# Patient Record
Sex: Male | Born: 1985
Health system: Southern US, Community
[De-identification: ages and names within clinical notes are randomized; demographics above are authoritative.]

## PROBLEM LIST (undated history)

## (undated) DIAGNOSIS — F419 Anxiety disorder, unspecified: Secondary | ICD-10-CM

## (undated) DIAGNOSIS — T7840XA Allergy, unspecified, initial encounter: Secondary | ICD-10-CM

## (undated) DIAGNOSIS — E781 Pure hyperglyceridemia: Secondary | ICD-10-CM

## (undated) DIAGNOSIS — K219 Gastro-esophageal reflux disease without esophagitis: Secondary | ICD-10-CM

## (undated) DIAGNOSIS — F32A Depression, unspecified: Secondary | ICD-10-CM

## (undated) DIAGNOSIS — B029 Zoster without complications: Secondary | ICD-10-CM

## (undated) DIAGNOSIS — F909 Attention-deficit hyperactivity disorder, unspecified type: Secondary | ICD-10-CM

## (undated) DIAGNOSIS — Z720 Tobacco use: Secondary | ICD-10-CM

## (undated) DIAGNOSIS — M722 Plantar fascial fibromatosis: Secondary | ICD-10-CM

## (undated) DIAGNOSIS — N529 Male erectile dysfunction, unspecified: Secondary | ICD-10-CM

## (undated) DIAGNOSIS — E041 Nontoxic single thyroid nodule: Secondary | ICD-10-CM

## (undated) HISTORY — DX: Morbid (severe) obesity due to excess calories: E66.01

## (undated) HISTORY — DX: Nontoxic single thyroid nodule: E04.1

## (undated) HISTORY — DX: Plantar fascial fibromatosis: M72.2

## (undated) HISTORY — DX: Anxiety disorder, unspecified: F41.9

## (undated) HISTORY — DX: Allergy, unspecified, initial encounter: T78.40XA

## (undated) HISTORY — DX: Depression, unspecified: F32.A

## (undated) HISTORY — DX: Gastro-esophageal reflux disease without esophagitis: K21.9

## (undated) HISTORY — DX: Male erectile dysfunction, unspecified: N52.9

## (undated) HISTORY — DX: Attention-deficit hyperactivity disorder, unspecified type: F90.9

## (undated) HISTORY — DX: Tobacco use: Z72.0

## (undated) HISTORY — DX: Pure hyperglyceridemia: E78.1

## (undated) HISTORY — DX: Zoster without complications: B02.9

---
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
## 2007-01-23 HISTORY — PX: WISDOM TOOTH EXTRACTION: SHX21

## 2008-04-10 ENCOUNTER — Emergency Department (HOSPITAL_BASED_OUTPATIENT_CLINIC_OR_DEPARTMENT_OTHER): Admission: EM | Admit: 2008-04-10 | Discharge: 2008-04-10 | Payer: Self-pay | Admitting: Emergency Medicine

## 2010-05-04 LAB — URINALYSIS, ROUTINE W REFLEX MICROSCOPIC
Glucose, UA: NEGATIVE mg/dL
Hgb urine dipstick: NEGATIVE
Specific Gravity, Urine: 1.025 (ref 1.005–1.030)

## 2010-05-04 LAB — COMPREHENSIVE METABOLIC PANEL
AST: 27 U/L (ref 0–37)
Albumin: 4.1 g/dL (ref 3.5–5.2)
Alkaline Phosphatase: 83 U/L (ref 39–117)
CO2: 25 mEq/L (ref 19–32)
Chloride: 104 mEq/L (ref 96–112)
Creatinine, Ser: 0.9 mg/dL (ref 0.4–1.5)
GFR calc Af Amer: >60 mL/min (ref >60)
GFR calc non Af Amer: >60 mL/min (ref >60)
Potassium: 3.8 mEq/L (ref 3.5–5.1)
Total Bilirubin: 1.2 mg/dL (ref 0.3–1.2)

## 2010-05-04 LAB — CBC
HCT: 45.3 % (ref 39.0–52.0)
MCV: 90 fL (ref 78.0–100.0)
RBC: 5.04 MIL/uL (ref 4.22–5.81)
WBC: 6.5 10*3/uL (ref 4.0–10.5)

## 2010-05-04 LAB — DIFFERENTIAL
Basophils Absolute: 0 10*3/uL (ref 0.0–0.1)
Basophils Relative: 0 % (ref 0–1)
Eosinophils Absolute: 0 10*3/uL (ref 0.0–0.7)
Lymphocytes Relative: 6 % — ABNORMAL LOW (ref 12–46)
Lymphs Abs: 0.4 10*3/uL — ABNORMAL LOW (ref 0.7–4.0)
Monocytes Absolute: 0.1 10*3/uL (ref 0.1–1.0)
Neutro Abs: 6 10*3/uL (ref 1.7–7.7)

## 2010-11-23 DIAGNOSIS — B029 Zoster without complications: Secondary | ICD-10-CM

## 2010-11-23 HISTORY — DX: Zoster without complications: B02.9

## 2010-12-15 ENCOUNTER — Encounter: Payer: Self-pay | Admitting: Family Medicine

## 2010-12-15 ENCOUNTER — Ambulatory Visit (INDEPENDENT_AMBULATORY_CARE_PROVIDER_SITE_OTHER): Payer: 59 | Admitting: Family Medicine

## 2010-12-15 DIAGNOSIS — B029 Zoster without complications: Secondary | ICD-10-CM

## 2010-12-15 DIAGNOSIS — Z23 Encounter for immunization: Secondary | ICD-10-CM

## 2010-12-15 DIAGNOSIS — K219 Gastro-esophageal reflux disease without esophagitis: Secondary | ICD-10-CM

## 2010-12-15 MED ORDER — VALACYCLOVIR HCL 1 G PO TABS: 1000.0000 mg | ORAL_TABLET | Freq: Three times a day (TID) | ORAL | Status: DC

## 2010-12-15 MED ORDER — OMEPRAZOLE 20 MG PO CPDR: 20.0000 mg | DELAYED_RELEASE_CAPSULE | Freq: Every day | ORAL | Status: DC

## 2010-12-15 NOTE — Patient Instructions (Signed)
Gastroesophageal Reflux Disease, Adult Gastroesophageal reflux disease (GERD) happens when acid from your stomach flows up into the esophagus. When acid comes in contact with the esophagus, the acid causes soreness (inflammation) in the esophagus. Over time, GERD may create small holes (ulcers) in the lining of the esophagus. CAUSES   Increased body weight. This puts pressure on the stomach, making acid rise from the stomach into the esophagus.   Smoking. This increases acid production in the stomach.   Drinking alcohol. This causes decreased pressure in the lower esophageal sphincter (valve or ring of muscle between the esophagus and stomach), allowing acid from the stomach into the esophagus.   Late evening meals and a full stomach. This increases pressure and acid production in the stomach.   A malformed lower esophageal sphincter.  Sometimes, no cause is found. SYMPTOMS   Burning pain in the lower part of the mid-chest behind the breastbone and in the mid-stomach area. This may occur twice a week or more often.   Trouble swallowing.   Sore throat.   Dry cough.   Asthma-like symptoms including chest tightness, shortness of breath, or wheezing.  DIAGNOSIS  Your caregiver may be able to diagnose GERD based on your symptoms. In some cases, X-rays and other tests may be done to check for complications or to check the condition of your stomach and esophagus. TREATMENT  Your caregiver may recommend over-the-counter or prescription medicines to help decrease acid production. Ask your caregiver before starting or adding any new medicines.  HOME CARE INSTRUCTIONS   Change the factors that you can control. Ask your caregiver for guidance concerning weight loss, quitting smoking, and alcohol consumption.   Avoid foods and drinks that make your symptoms worse, such as:   Caffeine or alcoholic drinks.   Chocolate.   Peppermint or mint flavorings.   Garlic and onions.   Spicy foods.     Citrus fruits, such as oranges, lemons, or limes.   Tomato-based foods such as sauce, chili, salsa, and pizza.   Fried and fatty foods.   Avoid lying down for the 3 hours prior to your bedtime or prior to taking a nap.   Eat small, frequent meals instead of large meals.   Wear loose-fitting clothing. Do not wear anything tight around your waist that causes pressure on your stomach.   Raise the head of your bed 6 to 8 inches with wood blocks to help you sleep. Extra pillows will not help.   Only take over-the-counter or prescription medicines for pain, discomfort, or fever as directed by your caregiver.   Do not take aspirin, ibuprofen, or other nonsteroidal anti-inflammatory drugs (NSAIDs).  SEEK IMMEDIATE MEDICAL CARE IF:   You have pain in your arms, neck, jaw, teeth, or back.   Your pain increases or changes in intensity or duration.   You develop nausea, vomiting, or sweating (diaphoresis).   You develop shortness of breath, or you faint.   Your vomit is green, yellow, black, or looks like coffee grounds or blood.   Your stool is red, bloody, or black.  These symptoms could be signs of other problems, such as heart disease, gastric bleeding, or esophageal bleeding. MAKE SURE YOU:   Understand these instructions.   Will watch your condition.   Will get help right away if you are not doing well or get worse.  Document Released: 10/18/2004 Document Revised: 09/20/2010 Document Reviewed: 07/28/2010 [facility] Patient Information 2012 [place], [place].Shingles Shingles is caused by the same virus that causes  chickenpox (varicella zoster virus or VZV). Shingles often occurs many years or decades after having chickenpox. That is why it is more common in adults older than 50 years. The virus reactivates and breaks out as an infection in a nerve root. SYMPTOMS   The initial feeling (sensations) may be pain. This pain is usually described as:   Burning.   Stabbing.    Throbbing.   Tingling in the nerve root.   A red rash will follow in a couple days. The rash may occur in any area of the body and is usually on one side (unilateral) of the body in a band or belt-like pattern. The rash usually starts out as very small blisters (vesicles). They will dry up after 7 to 10 days. This is not usually a significant problem except for the pain it causes.   Long-lasting (chronic) pain is more likely in an elderly person. It can last months to years. This condition is called postherpetic neuralgia.  Shingles can be an extremely severe infection in someone with AIDS, a weakened immune system, or with forms of leukemia. It can also be severe if you are taking transplant medicines or other medicines that weaken the immune system. TREATMENT  Your caregiver will often treat you with:  Antiviral drugs.   Anti-inflammatory drugs.   Pain medicines.  Bed rest is very important in preventing the pain associated with herpes zoster (postherpetic neuralgia). Application of heat in the form of a hot water bottle or electric heating pad or gentle pressure with the hand is recommended to help with the pain or discomfort. PREVENTION  A varicella zoster vaccine is available to help protect against the virus. The Food and Drug Administration approved the varicella zoster vaccine for individuals 75 years of age and older. HOME CARE INSTRUCTIONS   Cool compresses to the area of rash may be helpful.   Only take over-the-counter or prescription medicines for pain, discomfort, or fever as directed by your caregiver.   Avoid contact with:   Babies.   Pregnant women.   Children with eczema.   Elderly people with transplants.   People with chronic illnesses, such as leukemia and AIDS.   If the area involved is on your face, you may receive a referral for follow-up to a specialist. It is very important to keep all follow-up appointments. This will help avoid eye complications,  chronic pain, or disability.  SEEK IMMEDIATE MEDICAL CARE IF:   You develop any pain (headache) in the area of the face or eye. This must be followed carefully by your caregiver or ophthalmologist. An infection in part of your eye (cornea) can be very serious. It could lead to blindness.   You do not have pain relief from prescribed medicines.   Your redness or swelling spreads.   The area involved becomes very swollen and painful.   You have a fever.   You notice any red or painful lines extending away from the affected area toward your heart (lymphangitis).   Your condition is worsening or has changed.  Document Released: 01/08/2005 Document Revised: 09/20/2010 Document Reviewed: 12/13/2008 [facility] Patient Information 2012 [place], [place].

## 2010-12-15 NOTE — Progress Notes (Signed)
Office Note 12/16/2010  CC:  Chief Complaint  Patient presents with  . Establish Care    new patient    HPI:  Cesar Young is a 25 y.o. White male who is here to establish care and discuss pain in abdomen. Patient's most recent primary MD: seen intermittently at [facility]gent [facility] on [address] in [place]. Old records were not reviewed prior to or during today's visit.  Pt here with his girlfriend today. He complains of pain in left upper abd area for the last 5d.  It extends around left side into the left side of his back.  Says it seems like it is on the surface, not deep--"like it might be muscle pain or something".  Describes some tingling, prickly sensation in the area as well.  Pain worsening gradually. Has noted a few red bumps appear int the area of pain over the last 2 days.  Pain is the worst in the morning when he wakes up, seems better with activity. Denies any recent abdominal strain or trauma.  Pain unchanged by eating or drinking.  He has applied some heat and this has helped some.  Took some ibuprofen but not consistently, thinks it helped a little.   Denies fever, malaise, constipation, diarrhea, melena, hematochezia, nausea, or vomiting.  He does says he has bad gastroesophageal reflux x "years", mainly up into upper chest and throat region.  Regurgitates frequently.  Takes rolaids some, rarely takes any OTC h2 blocker or PPI.  Admits to drinking lots of Mt Dew and other typical GER-inducing foods, also overeats and eats late in evening frequently.  No significant ST, no cough/aspiration, no voice changes.  Sx's seem the worst right after eating.  He sleeps on 2 pillows but doesn't elevate the head of his bed.    Past Medical History  Diagnosis Date  . Asthma childhood    No albuterol requirement in years per pt report  . Allergic rhinitis   . Plantar fasciitis     [employer] orthotics helping    Past Surgical History  Procedure Date  . Wisdom tooth extraction 2009    . Adenoidectomy 1993    Family History  Problem Relation Age of Onset  . GER disease Mother   . Cancer Father     thyroid cancer ("non-familial type"  . Depression Father     History   Social History  . Marital Status: Single    Spouse Name: N/A    Number of Children: N/A  . Years of Education: N/A   Occupational History  . Not on file.   Social History Main Topics  . Smoking status: Current Everyday Smoker -- 0.5 packs/day for 3 years    Types: Cigarettes  . Smokeless tobacco: Never Used  . Alcohol Use: Yes     1 or 2 beers  . Drug Use: No  . Sexually Active: Not on file   Other Topics Concern  . 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   MEDS CURRENTLY: Tums, ibuprofen prn (the omeprazole and valtrex were added AFTER today's encounter). Outpatient Encounter Prescriptions as of 12/15/2010  Medication Sig Dispense Refill  . calcium carbonate (TUMS - DOSED IN MG ELEMENTAL CALCIUM) 500 MG chewable  tablet Chew 6 tablets by mouth daily.        Marland Kitchen omeprazole (PRILOSEC) 20 MG capsule Take 1 capsule (20 mg total) by mouth daily.  30 capsule  5  . valACYclovir (VALTREX) 1000 MG tablet Take 1 tablet (1,000 mg total) by mouth 3 (three) times daily.  21 tablet  0    Allergies  Allergen Reactions  . Cephalosporins   . Penicillins     ROS Review of Systems  Constitutional: Negative for fever, chills, appetite change and fatigue.  HENT: Negative for ear pain, congestion, sore throat, neck stiffness and dental problem.   Eyes: Negative for discharge, redness and visual disturbance.  Respiratory: Negative for cough, chest tightness, shortness of breath and wheezing.   Cardiovascular: Negative for chest pain,  palpitations and leg swelling.  Gastrointestinal: Negative for nausea, vomiting, diarrhea and blood in stool.       See HPI  Genitourinary: Negative for dysuria, urgency, frequency, hematuria, flank pain and difficulty urinating.  Musculoskeletal: Negative for myalgias, back pain, joint swelling and arthralgias.  Skin: Negative for pallor.       See HPI  Neurological: Negative for dizziness, speech difficulty, weakness and headaches.  Hematological: Negative for adenopathy. Does not bruise/bleed easily.  Psychiatric/Behavioral: Negative for confusion, sleep disturbance and dysphoric mood. The patient is not nervous/anxious.      PE; Blood pressure 139/84, pulse 64, temperature 98 F (36.7 C), temperature source Oral, height 5\' 9"  (1.753 m), weight 243 lb 12.8 oz (110.587 kg), SpO2 97.00%. Gen: Alert, well appearing, mildly obese-appearing.  Patient is oriented to person, place, time, and situation.  Pleasant affect. ENT: Ears: EACs clear, normal epithelium.  TMs with good light reflex and landmarks bilaterally.  Eyes: no injection, icteris, swelling, or exudate.  EOMI, PERRLA. Nose: no drainage or turbinate edema/swelling.  No injection or focal lesion.  Mouth: lips without lesion/swelling.  Oral mucosa pink and moist.  Dentition intact and without obvious caries or gingival swelling.  Oropharynx without erythema, exudate, or swelling.  Neck - No masses or thyromegaly or limitation in range of motion CV: RRR, no m/r/g.   LUNGS: CTA bilat, nonlabored resps, good aeration in all lung fields. ABD: soft, ND, BS normal.  No hepatospenomegaly or mass.  No bruits. He has hypesthesia of LUQ skin, with a couple of small pinkish, papular skin lesions.  No vesicles or petechiae or pustules.  He has mild discomfort at the most when the LUQ is palpated, no change with contraction of abd wall muscles when palpating.  No guarding or rebound. His hypesthesia extends in a dermatomal pattern around his left  side into left side of his back, stopping at the midline.  He has 2 other pink papules in the area of his left side and back in this dermatomal region.  No warmth. EXT: no clubbing, cyanosis, or edema.   Pertinent labs:  none  ASSESSMENT AND PLAN:   New patient: obtain old records.  Herpes zoster Valtrex 1g tid x 7d.  Therapeutic expectations and side effect profile of medication discussed today.  Patient's questions answered. Discussed dx in detail, expected course, contact precautions, etc.  Reviewed educational handout on shingles, gave this to pt to take home. May continue to take ibuprofen or tylenol for pain since it seems pretty mild and he's tolerating it well at this time.  GERD (gastroesophageal reflux disease) Start omeprazole 20mg  qd, encouraged dietary changes and behavioral reflux precautions, reviewed educational handout on GERD and gave this to him  to take home.  He may continue Tums or rolaids prn. F/u in 2 wks.   Tdap and Flu vaccine IM given today.  Return in about 2 weeks (around 12/29/2010) for f/u shingles and GERD.

## 2010-12-16 ENCOUNTER — Telehealth: Payer: Self-pay | Admitting: Family Medicine

## 2010-12-16 ENCOUNTER — Encounter: Payer: Self-pay | Admitting: Family Medicine

## 2010-12-16 DIAGNOSIS — K219 Gastro-esophageal reflux disease without esophagitis: Secondary | ICD-10-CM | POA: Insufficient documentation

## 2010-12-16 NOTE — Assessment & Plan Note (Signed)
Start omeprazole 20mg  qd, encouraged dietary changes and behavioral reflux precautions, reviewed educational handout on GERD and gave this to him to take home.  He may continue Tums or rolaids prn. F/u in 2 wks.

## 2010-12-16 NOTE — Assessment & Plan Note (Signed)
Valtrex 1g tid x 7d.  Therapeutic expectations and side effect profile of medication discussed today.  Patient's questions answered. Discussed dx in detail, expected course, contact precautions, etc.  Reviewed educational handout on shingles, gave this to pt to take home. May continue to take ibuprofen or tylenol for pain since it seems pretty mild and he's tolerating it well at this time.

## 2010-12-16 NOTE — Telephone Encounter (Signed)
Pls request records from [facility]gent [facility] on [address] in [place].  Thx.

## 2010-12-18 NOTE — Telephone Encounter (Signed)
Done

## 2010-12-29 ENCOUNTER — Encounter: Payer: Self-pay | Admitting: Family Medicine

## 2010-12-29 ENCOUNTER — Ambulatory Visit (INDEPENDENT_AMBULATORY_CARE_PROVIDER_SITE_OTHER): Payer: 59 | Admitting: Family Medicine

## 2010-12-29 DIAGNOSIS — B029 Zoster without complications: Secondary | ICD-10-CM

## 2010-12-29 DIAGNOSIS — K219 Gastro-esophageal reflux disease without esophagitis: Secondary | ICD-10-CM

## 2010-12-29 NOTE — Progress Notes (Signed)
OFFICE NOTE  12/29/2010  CC:  Chief Complaint  Patient presents with  . Follow-up    Shingles and Genella Rife     HPI:   Patient is a 25 y.o. Caucasian male who is here for f/u herpes zoster. Resolved 3-4 days ago, some dry bumps only.  No pain. Gerd improved with some dietary changes and elevation of head of bed. No prilosec yet.    Pertinent PMH:  GERD  MEDS;   Outpatient Prescriptions Prior to Visit  Medication Sig Dispense Refill  . calcium carbonate (TUMS - DOSED IN MG ELEMENTAL CALCIUM) 500 MG chewable tablet Chew 6 tablets by mouth daily.        Marland Kitchen omeprazole (PRILOSEC) 20 MG capsule Take 1 capsule (20 mg total) by mouth daily.  30 capsule  5  . valACYclovir (VALTREX) 1000 MG tablet Take 1 tablet (1,000 mg total) by mouth 3 (three) times daily.  21 tablet  0    PE: Blood pressure 132/81, pulse 82, temperature 97.7 F (36.5 C), temperature source Temporal, height 5\' 9"  (1.753 m), weight 247 lb (112.038 kg), SpO2 98.00%. Gen: Alert, well appearing.  Patient is oriented to person, place, time, and situation. ENT: Ears: EACs clear, normal epithelium.  TMs with good light reflex and landmarks bilaterally.  Eyes: no injection, icteris, swelling, or exudate.  EOMI, PERRLA. Nose: no drainage or turbinate edema/swelling.  No injection or focal lesion.  Mouth: lips without lesion/swelling.  Oral mucosa pink and moist.  Dentition intact and without obvious caries or gingival swelling.  Oropharynx without erythema, exudate, or swelling.  Neck - No masses or thyromegaly or limitation in range of motion SKIN: left side of upper abdomen, left flank, and left side of mid back with about 5-6 dry, pinkish papules that are barely palpable.  No hypesthesia of skin in the area anymore.  No erythema.  No tenderness.  IMPRESSION AND PLAN:  Herpes zoster Resolved.  No lingering pain or sensory sx's.  GERD (gastroesophageal reflux disease) Much improved just with dietary mod and elevation of head  of bed.  Gave dexilant 60mg  samples (#20) to use until he fills his omeprazole rx I gave him last time.     FOLLOW UP:  Return if symptoms worsen or fail to improve.

## 2010-12-29 NOTE — Assessment & Plan Note (Signed)
Much improved just with dietary mod and elevation of head of bed.  Gave dexilant 60mg  samples (#20) to use until he fills his omeprazole rx I gave him last time.

## 2010-12-29 NOTE — Assessment & Plan Note (Signed)
Resolved.  No lingering pain or sensory sx's.

## 2011-06-06 ENCOUNTER — Ambulatory Visit: Payer: 59 | Admitting: Family Medicine

## 2011-09-07 ENCOUNTER — Ambulatory Visit (INDEPENDENT_AMBULATORY_CARE_PROVIDER_SITE_OTHER): Payer: 59 | Admitting: Family Medicine

## 2011-09-07 ENCOUNTER — Encounter: Payer: Self-pay | Admitting: Family Medicine

## 2011-09-07 VITALS — BP 119/80 | HR 58 | Temp 97.3°F | Ht 69.0 in | Wt 249.4 lb

## 2011-09-07 DIAGNOSIS — R0789 Other chest pain: Secondary | ICD-10-CM

## 2011-09-07 MED ORDER — OMEPRAZOLE 20 MG PO CPDR: 20.0000 mg | DELAYED_RELEASE_CAPSULE | Freq: Every day | ORAL | Status: DC

## 2011-09-07 NOTE — Assessment & Plan Note (Signed)
Multifactorial: musculoskeletal, stress/anxiety, GERD. Reassured pt that I have low suspicion of any heart or lung problem that is causing this pain. No w/u indicated at this time. Rx for prilosec given today.  Reviewed reflux diet/precautions. Answered questions.  Encouraged regular exercise and stress releasing activities. F/u prn.

## 2011-09-07 NOTE — Progress Notes (Signed)
Office Note 09/07/2011  CC:  Chief Complaint  Patient presents with  . Annual Exam    physical    HPI:  Cesar Young is a 26 y.o. White male who is here for evaluation of recent CP. Onset a couple of weeks ago, pain in left upper chest, dull pain lasting a few minutes, not exertional. Spontaneously resolved.  One day when it lasted on and off x 30 min he took one ASA and he feels like this may have helped.  No nausea, no diaphoresis, no radiation of pain into arm or neck/jaw.  New job working long hours, feels like this is stressing him out b/c he feels like he doesn't have a life anymore.  Denies depression. He quit smoking b/c of fear 1 wk ago.  He started dipping now. Occasionally exercises (jogging): no pain with this.  Some exertional work with his job does not induce chest pain.  Describes some burning in epigastric region sometimes assoc with the pain in chest, but usually unrelated.  Lately has been taking Tums and pepcid complete lately: gets temporary relief only. No prolonged immobilization lately.   Past Medical History  Diagnosis Date  . Asthma childhood    No albuterol requirement in years per pt report  . Allergic rhinitis   . Plantar fasciitis     [employer] orthotics helping    Past Surgical History  Procedure Date  . Wisdom tooth extraction 2009  . Adenoidectomy 1993    Family History  Problem Relation Age of Onset  . GER disease Mother   . Cancer Father     thyroid cancer ("non-familial type"  . Depression Father   MGF: blood clots, recurrent (?unclear hx).  History   Social History  . Marital Status: Single    Spouse Name: N/A    Number of Children: N/A  . Years of Education: N/A   Occupational History  . Not on file.   Social History Main Topics  . Smoking status: Former Smoker -- 0.5 packs/day for 3 years    Types: Cigarettes    Quit date: 08/31/2011  . Smokeless tobacco: Current User  . Alcohol Use: Yes     occasionally 1 or 2  beers every couple months  . Drug Use: No  . Sexually Active: Yes -- Male partner(s)   Other Topics Concern  . 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    Outpatient Prescriptions Prior to Visit  Medication Sig Dispense Refill  . calcium carbonate (TUMS - DOSED IN MG ELEMENTAL CALCIUM) 500 MG chewable tablet Chew 6 tablets by mouth daily.        Marland Kitchen omeprazole (PRILOSEC) 20 MG capsule Take 1 capsule (20 mg total) by mouth daily.  30 capsule  5  . valACYclovir (VALTREX) 1000 MG tablet Take 1 tablet (1,000 mg total) by mouth 3 (three) times daily.  21 tablet  0    Allergies  Allergen Reactions  . Cephalosporins   . Penicillins     ROS Review of Systems  Constitutional: Negative for fever, chills, appetite change and fatigue.  HENT: Negative for ear pain, congestion, sore throat, neck stiffness and dental problem.   Eyes: Negative for  discharge, redness and visual disturbance.  Respiratory: Negative for cough, chest tightness, shortness of breath and wheezing.   Cardiovascular: Negative for palpitations and leg swelling.  Gastrointestinal: Positive for abdominal pain (intermittent mid epigastric burning discomfort). Negative for nausea, vomiting, diarrhea and blood in stool.  Genitourinary: Negative for dysuria, urgency, frequency, hematuria, flank pain and difficulty urinating.  Musculoskeletal: Negative for myalgias, back pain, joint swelling and arthralgias.  Skin: Negative for pallor and rash.  Neurological: Negative for dizziness, speech difficulty, weakness and headaches.  Hematological: Negative for adenopathy. Does not bruise/bleed easily.  Psychiatric/Behavioral: Negative for  confusion and disturbed wake/sleep cycle. The patient is not nervous/anxious.     PE; Blood pressure 119/80, pulse 58, temperature 97.3 F (36.3 C), temperature source Temporal, height 5\' 9"  (1.753 m), weight 249 lb 6.4 oz (113.127 kg), SpO2 99.00%. Gen: Alert, well appearing, overweight white male..  Patient is oriented to person, place, time, and situation. Affect: pleasant.   Lucid thought and speech. ENT: Eyes: no injection, icteris, swelling, or exudate.  EOMI, PERRLA. Nose: no drainage or turbinate edema/swelling.  No injection or focal lesion.  Mouth: lips without lesion/swelling.  Oral mucosa pink and moist.  Dentition intact and without obvious caries or gingival swelling.  Oropharynx without erythema, exudate, or swelling.  Neck: supple/nontender.  No LAD, mass, or TM.  Carotid pulses 2+ bilaterally, without bruits. CV: RRR, no m/r/g.   LUNGS: CTA bilat, nonlabored resps, good aeration in all lung fields. ABD: soft, NT, ND, BS normal.  No hepatospenomegaly or mass.  No bruits. EXT: no clubbing, cyanosis, or edema.   Pertinent labs:  none  ASSESSMENT AND PLAN:   Atypical chest pain Multifactorial: musculoskeletal, stress/anxiety, GERD. Reassured pt that I have low suspicion of any heart or lung problem that is causing this pain. No w/u indicated at this time. Rx for prilosec given today.  Reviewed reflux diet/precautions. Answered questions.  Encouraged regular exercise and stress releasing activities. F/u prn.    FOLLOW UP:  Return if symptoms worsen or fail to improve.

## 2011-09-14 ENCOUNTER — Telehealth: Payer: Self-pay | Admitting: Family Medicine

## 2011-09-14 NOTE — Telephone Encounter (Signed)
Calling for sore throat that started 09/11/11, tonsils swollen, afebrile, wants appointment for 09/15/11 and needs note for work. Emergent signs and symptoms ruled out as per Sore Throat protocol and home care advice given, scheduled appointment as per request for 09/15/11 at [facility] [address] Dr[address] Milinda Cave.

## 2011-09-15 ENCOUNTER — Encounter: Payer: Self-pay | Admitting: Family Medicine

## 2011-09-15 ENCOUNTER — Ambulatory Visit (INDEPENDENT_AMBULATORY_CARE_PROVIDER_SITE_OTHER): Payer: 59 | Admitting: Family Medicine

## 2011-09-15 VITALS — BP 122/62 | HR 57 | Temp 97.9°F | Ht 70.0 in | Wt 254.0 lb

## 2011-09-15 DIAGNOSIS — J029 Acute pharyngitis, unspecified: Secondary | ICD-10-CM

## 2011-09-15 LAB — POCT RAPID STREP A (OFFICE): Rapid Strep A Screen: NEGATIVE

## 2011-09-15 NOTE — Progress Notes (Signed)
OFFICE NOTE  09/15/2011  CC:  Chief Complaint  Patient presents with  . Sore Throat    x 3 days and swollen tonsils     HPI: Patient is a 26 y.o. Caucasian male who is here for sore throat. Onset 4 days ago, swelling of tonsils and uvula noted by pt, took tylenol twice yesterday and it was helpful--feels improved today.  No fever.  No nasal sx's.  Only occasional cough.  Feeling a bit tired but also says he works a lot of hours, up on feet all day.  However, felt tired today and he got 9 hours of sleep last night. No rash.  Mild HA on and off in last 4d--comes and goes.  Pertinent PMH:  Past Medical History  Diagnosis Date  . Asthma childhood    No albuterol requirement in years per pt report  . Allergic rhinitis   . Plantar fasciitis     [employer] orthotics helping    MEDS:  Outpatient Prescriptions Prior to Visit  Medication Sig Dispense Refill  . calcium carbonate (TUMS - DOSED IN MG ELEMENTAL CALCIUM) 500 MG chewable tablet Chew 6 tablets by mouth daily.        Marland Kitchen omeprazole (PRILOSEC) 20 MG capsule Take 1 capsule (20 mg total) by mouth daily.  90 capsule  1    PE: Blood pressure 122/62, pulse 57, temperature 97.9 F (36.6 C), temperature source Oral, height 5\' 10"  (1.778 m), weight 254 lb (115.214 kg), SpO2 97.00%. Gen: Alert, well appearing.  Patient is oriented to person, place, time, and situation. ENT: Ears: EACs clear, normal epithelium.  TMs with good light reflex and landmarks bilaterally.  Eyes: no injection, icteris, swelling, or exudate.  EOMI, PERRLA. Nose: no drainage or turbinate edema/swelling.  No injection or focal lesion.  Mouth: lips without lesion/swelling.  Oral mucosa pink and moist.  Dentition intact and without obvious caries or gingival swelling.  Oropharynx with mild diffuse tonsillar and soft palate and posterior pharynx erythema but no exudate.  Mild symmetric swelling.   CV: RRR, no m/r/g.   LUNGS: CTA bilat, nonlabored resps, good aeration in  all lung fields.  Lab: rapid strep negative today  IMPRESSION AND PLAN:   Viral pharyngitis. Symptomatic care discussed. Rest, fluids, etc.  FOLLOW UP: prn

## 2011-12-27 ENCOUNTER — Telehealth: Payer: Self-pay | Admitting: Family Medicine

## 2011-12-27 NOTE — Telephone Encounter (Signed)
Patient Information:  Caller Name: Newel  Phone: [phone]  Patient: Cesar Young, Cesar Young  Gender: Male  DOB: 06/26/1985  Age: 26 Years  PCP: Earley Favor [facility])   Symptoms  Reason For Call & Symptoms: Patient is calling about a headache. Onset 3 days. Pain is constant and it will with medication (Tylenol 500mg ) but then it comes back.  location- middle of forehead and behind eyes. Slight congestion/slight cough but very minimal. +nausea  Reviewed Health History In EMR: Yes  Reviewed Medications In EMR: Yes  Reviewed Allergies In EMR: Yes  Reviewed Surgeries / Procedures: No  Date of Onset of Symptoms: 12/24/2011  Treatments Tried: tylenol  Treatments Tried Worked: No  Guideline(s) Used:  Headache  Disposition Per Guideline:   See Today or Tomorrow in Office  Reason For Disposition Reached:   Unexplained headache that is present > 24 hours  Advice Given:  Reassurance - Migraine Headache:  This sounds like a painful headache that you are having, but there are pain medications you can take and other instructions I can give you to reduce the pain.  Reassurance - Muscle Tension Headache:  The majority of headaches are caused by muscle tension.  This sounds like a painful headache that you are having, but there are pain medications you can take and other instructions I can give you to reduce the pain.  Pain Medicines:  For pain relief, you can take either acetaminophen, ibuprofen, or naproxen.  They are over-the-counter (OTC) pain drugs. You can buy them at the drugstore.  Pain Medicines:  For pain relief, you can take either acetaminophen, ibuprofen, or naproxen.  They are over-the-counter (OTC) pain drugs. You can buy them at the drugstore.  Acetaminophen (e.g., Tylenol):  Extra Strength Tylenol: Take 1,000 mg (two 500 mg pills) every 8 hours as needed. Each Extra Strength Tylenol pill has 500 mg of acetaminophen.  Ibuprofen (e.g., Motrin, Advil):  Take 400  mg (two 200 mg pills) by mouth every 6 hours.  Rest:   Lie down in a dark, quiet place and try to relax. Close your eyes and imagine your entire body relaxing.  Rest:   Lie down in a dark, quiet place and try to relax. Close your eyes and imagine your entire body relaxing.  Apply Cold to the Area:   Apply a cold wet washcloth or cold pack to the forehead for 20 minutes.  Apply Cold to the Area:   Apply a cold wet washcloth or cold pack to the forehead for 20 minutes.  Call Back If:  Headache lasts longer than 24 hours  You become worse.  Office Follow Up:  Does the office need to follow up with this patient?: No  Instructions For The Office: N/A  Appointment Scheduled:  12/28/2011 15:00:00 Appointment Scheduled Provider:  Danise Edge (Family Practice)  RN Note:  He states he drinks Energy drinks , [facility] and 5 hours energy daily and has 2 sodas as well.  Advised to stop energy drinks. Increase fluids.   Encouraged to increase Tylenol to 500mg  every 8 hours x2  Tablets.

## 2011-12-28 ENCOUNTER — Ambulatory Visit (INDEPENDENT_AMBULATORY_CARE_PROVIDER_SITE_OTHER): Payer: 59 | Admitting: Family Medicine

## 2011-12-28 ENCOUNTER — Encounter: Payer: Self-pay | Admitting: Family Medicine

## 2011-12-28 VITALS — BP 126/72 | HR 87 | Temp 98.2°F | Ht 69.0 in | Wt 251.8 lb

## 2011-12-28 DIAGNOSIS — Z720 Tobacco use: Secondary | ICD-10-CM

## 2011-12-28 DIAGNOSIS — F17200 Nicotine dependence, unspecified, uncomplicated: Secondary | ICD-10-CM

## 2011-12-28 DIAGNOSIS — R51 Headache: Secondary | ICD-10-CM | POA: Insufficient documentation

## 2011-12-28 DIAGNOSIS — Z23 Encounter for immunization: Secondary | ICD-10-CM

## 2011-12-28 DIAGNOSIS — R41840 Attention and concentration deficit: Secondary | ICD-10-CM

## 2011-12-28 DIAGNOSIS — R519 Headache, unspecified: Secondary | ICD-10-CM | POA: Insufficient documentation

## 2011-12-28 HISTORY — DX: Tobacco use: Z72.0

## 2011-12-28 MED ORDER — BUTALBITAL-ACETAMINOPHEN 50-325 MG PO TABS: 1.0000 | ORAL_TABLET | Freq: Three times a day (TID) | ORAL | Status: DC | PRN

## 2011-12-28 NOTE — Patient Instructions (Addendum)
Make sure to get enough sleep and hydrate and eat regularly Headaches, Frequently Asked Questions MIGRAINE HEADACHES Q: What is migraine? What causes it? How can I treat it? A: Generally, migraine headaches begin as a dull ache. Then they develop into a constant, throbbing, and pulsating pain. You may experience pain at the temples. You may experience pain at the front or back of one or both sides of the head. The pain is usually accompanied by a combination of:  Nausea.  Vomiting.  Sensitivity to light and noise. Some people (about 15%) experience an aura (see below) before an attack. The cause of migraine is believed to be chemical reactions in the brain. Treatment for migraine may include over-the-counter or prescription medications. It may also include self-help techniques. These include relaxation training and biofeedback.  Q: What is an aura? A: About 15% of people with migraine get an "aura". This is a sign of neurological symptoms that occur before a migraine headache. You may see wavy or jagged lines, dots, or flashing lights. You might experience tunnel vision or blind spots in one or both eyes. The aura can include visual or auditory hallucinations (something imagined). It may include disruptions in smell (such as strange odors), taste or touch. Other symptoms include:  Numbness.  A "pins and needles" sensation.  Difficulty in recalling or speaking the correct word. These neurological events may last as long as 60 minutes. These symptoms will fade as the headache begins. Q: What is a trigger? A: Certain physical or environmental factors can lead to or "trigger" a migraine. These include:  Foods.  Hormonal changes.  Weather.  Stress. It is important to remember that triggers are different for everyone. To help prevent migraine attacks, you need to figure out which triggers affect you. Keep a headache diary. This is a good way to track triggers. The diary will help you talk to  your healthcare professional about your condition. Q: Does weather affect migraines? A: Bright sunshine, hot, humid conditions, and drastic changes in barometric pressure may lead to, or "trigger," a migraine attack in some people. But studies have shown that weather does not act as a trigger for everyone with migraines. Q: What is the link between migraine and hormones? A: Hormones start and regulate many of your body's functions. Hormones keep your body in balance within a constantly changing environment. The levels of hormones in your body are unbalanced at times. Examples are during menstruation, pregnancy, or menopause. That can lead to a migraine attack. In fact, about three quarters of all women with migraine report that their attacks are related to the menstrual cycle.  Q: Is there an increased risk of stroke for migraine sufferers? A: The likelihood of a migraine attack causing a stroke is very remote. That is not to say that migraine sufferers cannot have a stroke associated with their migraines. In persons under age 59, the most common associated factor for stroke is migraine headache. But over the course of a person's normal life span, the occurrence of migraine headache may actually be associated with a reduced risk of dying from cerebrovascular disease due to stroke.  Q: What are acute medications for migraine? A: Acute medications are used to treat the pain of the headache after it has started. Examples over-the-counter medications, NSAIDs, ergots, and triptans.  Q: What are the triptans? A: Triptans are the newest class of abortive medications. They are specifically targeted to treat migraine. Triptans are vasoconstrictors. They moderate some chemical reactions in the  brain. The triptans work on receptors in your brain. Triptans help to restore the balance of a neurotransmitter called serotonin. Fluctuations in levels of serotonin are thought to be a main cause of migraine.  Q: Are  over-the-counter medications for migraine effective? A: Over-the-counter, or "OTC," medications may be effective in relieving mild to moderate pain and associated symptoms of migraine. But you should see your caregiver before beginning any treatment regimen for migraine.  Q: What are preventive medications for migraine? A: Preventive medications for migraine are sometimes referred to as "prophylactic" treatments. They are used to reduce the frequency, severity, and length of migraine attacks. Examples of preventive medications include antiepileptic medications, antidepressants, beta-blockers, calcium channel blockers, and NSAIDs (nonsteroidal anti-inflammatory drugs). Q: Why are anticonvulsants used to treat migraine? A: During the past few years, there has been an increased interest in antiepileptic drugs for the prevention of migraine. They are sometimes referred to as "anticonvulsants". Both epilepsy and migraine may be caused by similar reactions in the brain.  Q: Why are antidepressants used to treat migraine? A: Antidepressants are typically used to treat people with depression. They may reduce migraine frequency by regulating chemical levels, such as serotonin, in the brain.  Q: What alternative therapies are used to treat migraine? A: The term "alternative therapies" is often used to describe treatments considered outside the scope of conventional Western medicine. Examples of alternative therapy include acupuncture, acupressure, and yoga. Another common alternative treatment is herbal therapy. Some herbs are believed to relieve headache pain. Always discuss alternative therapies with your caregiver before proceeding. Some herbal products contain arsenic and other toxins. TENSION HEADACHES Q: What is a tension-type headache? What causes it? How can I treat it? A: Tension-type headaches occur randomly. They are often the result of temporary stress, anxiety, fatigue, or anger. Symptoms include  soreness in your temples, a tightening band-like sensation around your head (a "vice-like" ache). Symptoms can also include a pulling feeling, pressure sensations, and contracting head and neck muscles. The headache begins in your forehead, temples, or the back of your head and neck. Treatment for tension-type headache may include over-the-counter or prescription medications. Treatment may also include self-help techniques such as relaxation training and biofeedback. CLUSTER HEADACHES Q: What is a cluster headache? What causes it? How can I treat it? A: Cluster headache gets its name because the attacks come in groups. The pain arrives with little, if any, warning. It is usually on one side of the head. A tearing or bloodshot eye and a runny nose on the same side of the headache may also accompany the pain. Cluster headaches are believed to be caused by chemical reactions in the brain. They have been described as the most severe and intense of any headache type. Treatment for cluster headache includes prescription medication and oxygen. SINUS HEADACHES Q: What is a sinus headache? What causes it? How can I treat it? A: When a cavity in the bones of the face and skull (a sinus) becomes inflamed, the inflammation will cause localized pain. This condition is usually the result of an allergic reaction, a tumor, or an infection. If your headache is caused by a sinus blockage, such as an infection, you will probably have a fever. An x-ray will confirm a sinus blockage. Your caregiver's treatment might include antibiotics for the infection, as well as antihistamines or decongestants.  REBOUND HEADACHES Q: What is a rebound headache? What causes it? How can I treat it? A: A pattern of taking acute headache medications  too often can lead to a condition known as "rebound headache." A pattern of taking too much headache medication includes taking it more than 2 days per week or in excessive amounts. That means more  than the label or a caregiver advises. With rebound headaches, your medications not only stop relieving pain, they actually begin to cause headaches. Doctors treat rebound headache by tapering the medication that is being overused. Sometimes your caregiver will gradually substitute a different type of treatment or medication. Stopping may be a challenge. Regularly overusing a medication increases the potential for serious side effects. Consult a caregiver if you regularly use headache medications more than 2 days per week or more than the label advises. ADDITIONAL QUESTIONS AND ANSWERS Q: What is biofeedback? A: Biofeedback is a self-help treatment. Biofeedback uses special equipment to monitor your body's involuntary physical responses. Biofeedback monitors:  Breathing.  Pulse.  Heart rate.  Temperature.  Muscle tension.  Brain activity. Biofeedback helps you refine and perfect your relaxation exercises. You learn to control the physical responses that are related to stress. Once the technique has been mastered, you do not need the equipment any more. Q: Are headaches hereditary? A: Four out of five (80%) of people that suffer report a family history of migraine. Scientists are not sure if this is genetic or a family predisposition. Despite the uncertainty, a child has a 50% chance of having migraine if one parent suffers. The child has a 75% chance if both parents suffer.  Q: Can children get headaches? A: By the time they reach high school, most young people have experienced some type of headache. Many safe and effective approaches or medications can prevent a headache from occurring or stop it after it has begun.  Q: What type of doctor should I see to diagnose and treat my headache? A: Start with your primary caregiver. Discuss his or her experience and approach to headaches. Discuss methods of classification, diagnosis, and treatment. Your caregiver may decide to recommend you to a  headache specialist, depending upon your symptoms or other physical conditions. Having diabetes, allergies, etc., may require a more comprehensive and inclusive approach to your headache. The National Headache Foundation will provide, upon request, a list of [facility] physician members in your state. Document Released: 03/31/2003 Document Revised: 04/02/2011 Document Reviewed: 09/08/2007 [facility] Patient Information 2013 [place], [place].

## 2011-12-31 ENCOUNTER — Encounter: Payer: Self-pay | Admitting: Family Medicine

## 2011-12-31 DIAGNOSIS — R41840 Attention and concentration deficit: Secondary | ICD-10-CM | POA: Insufficient documentation

## 2011-12-31 NOTE — Progress Notes (Signed)
Patient ID: Cesar Young, male   DOB: [birth year], 26 y.o.   MRN: [id number] HAWKE VILLALPANDO [id number] 1985-06-22 12/31/2011      Progress Note-Follow Up  Subjective  Chief Complaint  Chief Complaint  Patient presents with  . Headache    started on Sunday- Wed- then came back around 4 pm on thursday. slight headache today    HPI  Patient is a 26 year old Caucasian male who is in today for evaluation of headache he actually does one today but has had one off and on this last week more consistently than usual. Acknowledges he had 3-4 days of a constant headache which would respond to Tylenol but then would come back. He does note she also had some congestion malaise and some low-grade fevers as well. A few weeks back into more transitory headache had some nausea associated with that is not ongoing. Today no fevers no significant congestion. He also notes that his girlfriend is recently been diagnosed with ADD and is questioning whether to look may have it as well. No chest pain, palpitations, GI or GU complaints noted today.  Past Medical History  Diagnosis Date  . Asthma childhood    No albuterol requirement in years per pt report  . Allergic rhinitis   . Plantar fasciitis     [employer] orthotics helping  . Headache 12/28/2011  . Tobacco abuse 12/28/2011  . Poor concentration 12/31/2011    Past Surgical History  Procedure Date  . Wisdom tooth extraction 2009  . Adenoidectomy 1993    Family History  Problem Relation Age of Onset  . GER disease Mother   . Cancer Father     thyroid cancer ("non-familial type"  . Depression Father     History   Social History  . Marital Status: Single    Spouse Name: N/A    Number of Children: N/A  . Years of Education: N/A   Occupational History  . Not on file.   Social History Main Topics  . Smoking status: Current Some Day Smoker -- 0.5 packs/day for 3 years    Types: Cigarettes    Last Attempt to Quit: 08/31/2011  . Smokeless  tobacco: Current User  . Alcohol Use: Yes     Comment: occasionally 1 or 2 beers every couple months  . Drug Use: No  . Sexually Active: Yes -- Male partner(s)   Other Topics Concern  . 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, [place].Smokes 1/2 pack cigs/day x 3 yrs--recently quit as of 09/07/11!:  rare alcohol, no drug use.No exercise.    Current Outpatient Prescriptions on File Prior to Visit  Medication Sig Dispense Refill  . calcium carbonate (TUMS - DOSED IN MG ELEMENTAL CALCIUM) 500 MG chewable tablet Chew 6 tablets by mouth daily.        Marland Kitchen omeprazole (PRILOSEC) 20 MG capsule Take 1 capsule (20 mg total) by mouth daily.  90 capsule  1    Allergies  Allergen Reactions  . Cephalosporins   . Penicillins     Review of Systems  Review of Systems  Constitutional: Negative for fever and malaise/fatigue.  HENT: Positive for congestion.   Eyes: Negative for discharge.  Respiratory: Negative for shortness of breath.   Cardiovascular: Negative for chest pain, palpitations and leg swelling.  Gastrointestinal: Negative for nausea, abdominal pain and diarrhea.  Genitourinary: Negative for dysuria.  Musculoskeletal: Negative for falls.  Skin: Negative for rash.  Neurological: Positive for headaches. Negative for loss of consciousness.  Endo/Heme/Allergies: Negative for polydipsia.  Psychiatric/Behavioral: Negative for depression and suicidal ideas. The patient is not nervous/anxious and does not have insomnia.     Objective  BP 126/72  Pulse 87  Temp 98.2 F (36.8 C) (Temporal)  Ht 5\' 9"  (1.753 m)  Wt 251 lb 12.8 oz (114.216 kg)  BMI 37.18 kg/m2  SpO2 99%  Physical Exam  Physical Exam  Constitutional: He is oriented to person, place, and time and well-developed, well-nourished, and in no  distress. No distress.  HENT:  Head: Normocephalic and atraumatic.  Eyes: Conjunctivae normal are normal.  Neck: Neck supple. No thyromegaly present.  Cardiovascular: Normal rate, regular rhythm and normal heart sounds.   No murmur heard. Pulmonary/Chest: Effort normal and breath sounds normal. No respiratory distress.  Abdominal: He exhibits no distension and no mass. There is no tenderness.  Musculoskeletal: He exhibits no edema.  Neurological: He is alert and oriented to person, place, and time.  Skin: Skin is warm.  Psychiatric: Memory, affect and judgment normal.    No results found for this basename: TSH   Lab Results  Component Value Date   WBC 6.5 04/10/2008   HGB 15.3 04/10/2008   HCT 45.3 04/10/2008   MCV 90.0 04/10/2008   PLT 167 04/10/2008   Lab Results  Component Value Date   CREATININE .9 04/10/2008   BUN 16 04/10/2008   NA 137 04/10/2008   K 3.8 04/10/2008   CL 104 04/10/2008   CO2 25 04/10/2008   Lab Results  Component Value Date   ALT 15 04/10/2008   AST 27 04/10/2008   ALKPHOS 83 04/10/2008   BILITOT 1.2 04/10/2008    Assessment & Plan  Headache Actually better today, responded to tylenol yesterday, encouraged better hydration, diet and sleep, given a small amount of Butalbital to try prn  Tobacco abuse Encouraged complete cessation  Poor concentration He is questioning if he has ADD, he is scheduled to come back for some testing with his PMD after the holidays.

## 2011-12-31 NOTE — Assessment & Plan Note (Signed)
Encouraged complete cessation. 

## 2011-12-31 NOTE — Assessment & Plan Note (Addendum)
Actually better today, responded to tylenol yesterday, encouraged better hydration, diet and sleep, given a small amount of Butalbital to try prn

## 2011-12-31 NOTE — Assessment & Plan Note (Signed)
He is questioning if he has ADD, he is scheduled to come back for some testing with his PMD after the holidays.

## 2012-01-23 DIAGNOSIS — N529 Male erectile dysfunction, unspecified: Secondary | ICD-10-CM

## 2012-01-23 HISTORY — DX: Male erectile dysfunction, unspecified: N52.9

## 2012-02-25 ENCOUNTER — Encounter: Payer: Self-pay | Admitting: Family Medicine

## 2012-02-25 ENCOUNTER — Ambulatory Visit (INDEPENDENT_AMBULATORY_CARE_PROVIDER_SITE_OTHER): Payer: 59 | Admitting: Family Medicine

## 2012-02-25 VITALS — BP 139/75 | HR 71 | Temp 99.8°F | Ht 70.0 in | Wt 258.0 lb

## 2012-02-25 DIAGNOSIS — R0609 Other forms of dyspnea: Secondary | ICD-10-CM

## 2012-02-25 DIAGNOSIS — R0989 Other specified symptoms and signs involving the circulatory and respiratory systems: Secondary | ICD-10-CM

## 2012-02-25 DIAGNOSIS — R0600 Dyspnea, unspecified: Secondary | ICD-10-CM

## 2012-02-25 DIAGNOSIS — J45901 Unspecified asthma with (acute) exacerbation: Secondary | ICD-10-CM

## 2012-02-25 MED ORDER — IPRATROPIUM BROMIDE 0.02 % IN SOLN: 0.5000 mg | Freq: Once | RESPIRATORY_TRACT | Status: DC

## 2012-02-25 MED ORDER — PREDNISONE 20 MG PO TABS: ORAL_TABLET | ORAL | Status: DC

## 2012-02-25 MED ORDER — ALBUTEROL SULFATE HFA 108 (90 BASE) MCG/ACT IN AERS: INHALATION_SPRAY | RESPIRATORY_TRACT | Status: DC

## 2012-02-25 MED ORDER — ALBUTEROL SULFATE (5 MG/ML) 0.5% IN NEBU: 2.5000 mg | INHALATION_SOLUTION | Freq: Once | RESPIRATORY_TRACT | Status: DC

## 2012-02-25 NOTE — Assessment & Plan Note (Signed)
Non-wheezing asthmatic. He had good response to alb/atr neb in office today. I rx'd prednisone 40mg  qd x 5d and ventolin HFA 2 puffs q4h prn. CMA did inhaler education with him today in exam room.

## 2012-02-25 NOTE — Progress Notes (Signed)
OFFICE NOTE  02/25/2012  CC:  Chief Complaint  Patient presents with  . Dizziness    short of breath, chest tightness; symptoms began Sunday; chest congestion     HPI: Patient is a 27 y.o. Caucasian male who is here for 1d of feeling like he can't take deep breaths--"shortness of breath".  Tightness in chest.  Some dizzy spells the last few days, would last 15-20 min, triggered sometimes by bending over and grabbing something and sometimes just without provocation.  Of note, these spells started the morning after he had gotten acutely intoxicated, felt hungover. No palpitations or feeling of heart racing.  No recent URI or fever.  He has had a dry cough when he has been able to take a deep breath.  Denies hearing any wheezing.  No signif anxiety-provoking situations lately. Meds: took dramamine yesterday, tylenol--no signif help. He quit smoking 08/2011 but restarted and smokes around 1/2-1 pack of cigs per day.  Pertinent PMH:  Past Medical History  Diagnosis Date  . Asthma childhood    No albuterol requirement in years per pt report  . Allergic rhinitis   . Plantar fasciitis     [employer] orthotics helping  . Headache 12/28/2011  . Tobacco abuse 12/28/2011  . Poor concentration 12/31/2011   Past surgical, social, and family history reviewed and no changes noted since last office visit. (GF with hx of "clots", otherwise neg FH for venous thrombosis/PE).  No recent surgical procedure or prolonged immobilization.  MEDS:  Outpatient Prescriptions Prior to Visit  Medication Sig Dispense Refill  . acetaminophen (TYLENOL) 500 MG tablet Take 1,000 mg by mouth every 6 (six) hours as needed.      . calcium carbonate (TUMS - DOSED IN MG ELEMENTAL CALCIUM) 500 MG chewable tablet Chew 6 tablets by mouth daily.        Marland Kitchen omeprazole (PRILOSEC) 20 MG capsule Take 1 capsule (20 mg total) by mouth daily.  90 capsule  1  . ACETAMINOPHEN-BUTALBITAL 50-325 MG TABS Take 1 tablet by mouth 3 (three) times  daily as needed.  30 each  0   Last reviewed on 02/25/2012  4:12 PM by Jeoffrey Massed, MD  PE: Blood pressure 139/75, pulse 71, temperature 99.8 F (37.7 C), temperature source Temporal, height 5\' 10"  (1.778 m), weight 258 lb (117.028 kg), SpO2 98.00%. Gen: Alert, well appearing.  Patient is oriented to person, place, time, and situation. ENT: Ears: EACs clear, normal epithelium.  TMs with good light reflex and landmarks bilaterally.  Eyes: no injection, icteris, swelling, or exudate.  EOMI, PERRLA. Nose: no drainage or turbinate edema/swelling.  No injection or focal lesion.  Mouth: lips without lesion/swelling.  Oral mucosa pink and moist.  Dentition intact and without obvious caries or gingival swelling.  Oropharynx without erythema, exudate, or swelling.  Neck - No masses or thyromegaly or limitation in range of motion CV: RRR, no m/r/g.   LUNGS: CTA bilat, nonlabored resps, good aeration in all lung fields. ABD: soft, NT/ND EXT: no clubbing, cyanosis, or edema.    IMPRESSION AND PLAN:  Acute asthma exacerbation Non-wheezing asthmatic. He had good response to alb/atr neb in office today. I rx'd prednisone 40mg  qd x 5d and ventolin HFA 2 puffs q4h prn. CMA did inhaler education with him today in exam room.   An After Visit Summary was printed and given to the patient.  FOLLOW UP: prn

## 2012-02-27 MED ORDER — IPRATROPIUM-ALBUTEROL 0.5-2.5 (3) MG/3ML IN SOLN: 3.0000 mL | RESPIRATORY_TRACT | Status: DC

## 2012-02-27 MED ORDER — IPRATROPIUM-ALBUTEROL 0.5-2.5 (3) MG/3ML IN SOLN
3.0000 mL | Freq: Once | RESPIRATORY_TRACT | Status: AC
  Administered 2012-02-27: 3 mL via RESPIRATORY_TRACT

## 2012-02-27 NOTE — Addendum Note (Signed)
Addended by: Luisa Dago on: 02/27/2012 08:45 AM   Modules accepted: Orders

## 2012-10-23 ENCOUNTER — Encounter: Payer: Self-pay | Admitting: Family Medicine

## 2012-10-23 ENCOUNTER — Ambulatory Visit (INDEPENDENT_AMBULATORY_CARE_PROVIDER_SITE_OTHER): Payer: 59 | Admitting: Family Medicine

## 2012-10-23 VITALS — BP 134/77 | HR 82 | Temp 99.2°F | Resp 18 | Ht 70.0 in | Wt 251.0 lb

## 2012-10-23 DIAGNOSIS — F17200 Nicotine dependence, unspecified, uncomplicated: Secondary | ICD-10-CM

## 2012-10-23 DIAGNOSIS — E66811 Obesity, class 1: Secondary | ICD-10-CM

## 2012-10-23 DIAGNOSIS — E669 Obesity, unspecified: Secondary | ICD-10-CM

## 2012-10-23 DIAGNOSIS — N529 Male erectile dysfunction, unspecified: Secondary | ICD-10-CM

## 2012-10-23 DIAGNOSIS — Z0000 Encounter for general adult medical examination without abnormal findings: Secondary | ICD-10-CM

## 2012-10-23 NOTE — Assessment & Plan Note (Signed)
Reviewed age and gender appropriate health maintenance issues (prudent diet, regular exercise, health risks of tobacco and excessive alcohol, use of seatbelts, fire alarms in home, use of sunscreen).  Also reviewed age and gender appropriate health screening as well as vaccine recommendations. He declined flu vaccine today. He'll return when fasting to get screening BMET and FLP.

## 2012-10-23 NOTE — Assessment & Plan Note (Signed)
Suspect that this is largely psychogenic, but also somewhat related to smoking and his obesity. Discussed diet/exercise/wt loss strategies and smoking cessation strategies. No meds.

## 2012-10-23 NOTE — Assessment & Plan Note (Signed)
Encouraged cessation, discussed starting low-dose nicotine patch + use nicotine gum or lozenge prn. Ween down after 30d max.

## 2012-10-23 NOTE — Progress Notes (Signed)
Office Note 10/23/2012  CC:  Chief Complaint  Patient presents with  . general check up    HPI:  Cesar Young is a 27 y.o. White male who is here for CPE.  Discussed ED: 5-6 yr hx of problems getting erection firm enough for penetration and/or climax the majority of the time. Denies any psych factors playing a role that he is aware of, has been in monogamous relationship with the same girl for 2-3 yrs. He has similar problem with self stimulation.  He says sexual desire is intact.    Discussed tobacco abuse/dependence:  For years he has smoked 1/2-1 pack of light/off brand cigs per day and has failed many quit attempts due to poor motivation/lack of discipline (his words).  Tried nicotine lozenges x 1d and found that it did curb his desire to smoke but then says "I don't even know why I started smoking again".    Discussed overweight/obesity: describes himself as someone who "knows what I have to do but have trouble getting motivated to do it". Says he doesn't drink much water, drinks lots of sugary drinks and sports drinks with lots of caffeine.  Says he does some "emotional eating" frequently.  He walks some and runs rarely, otherwise no exercise.     Past Medical History  Diagnosis Date  . Asthma childhood    No albuterol requirement in years, then acute flare 02/2012  . Allergic rhinitis   . Plantar fasciitis     [employer] orthotics helping  . Headache(784.0) 12/28/2011  . Tobacco abuse 12/28/2011  . Poor concentration 12/31/2011  . Herpes zoster 11/2010    Past Surgical History  Procedure Laterality Date  . Wisdom tooth extraction  2009  . Adenoidectomy  1993    Family History  Problem Relation Age of Onset  . GER disease Mother   . Cancer Father     thyroid cancer ("non-familial type"  . Depression Father     History   Social History  . Marital Status: Single    Spouse Name: N/A    Number of Children: N/A  . Years of Education: N/A   Occupational  History  . Not on file.   Social History Main Topics  . Smoking status: Current Some Day Smoker -- 0.50 packs/day for 3 years    Types: Cigarettes    Last Attempt to Quit: 08/31/2011  . Smokeless tobacco: Current User  . Alcohol Use: Yes     Comment: occasionally 1 or 2 beers every couple months  . Drug Use: No  . Sexual Activity: Yes    Partners: Female   Other Topics Concern  . Not on file   Social History Narrative   Single, has a long-time girlfriend.  No children.   [employer] grad, college x 2 yrs ([employer] copy).   Currently working for [employer], plans on taking one class at [employer] soon in attempt to finish his associates of Arts degree--ultimately wants to get BA in Marketing at [employer].   Smokes 1/2 pack cigs/day x 5 yrs.  Occ alcohol--2-3 beers at a time--denies abuse.  No drug use.   No consistent exercise.  Diet is poor.   MEDS: Tylenol q6h prn pain, tums prn  Allergies  Allergen Reactions  . Cephalosporins   . Penicillins     ROS Review of Systems  Constitutional: Negative for fever, chills, appetite change and fatigue.  HENT: Negative for ear pain, congestion, sore throat, neck stiffness and  dental problem.   Eyes: Negative for discharge, redness and visual disturbance.  Respiratory: Negative for cough, chest tightness, shortness of breath and wheezing.   Cardiovascular: Negative for chest pain, palpitations and leg swelling.  Gastrointestinal: Negative for nausea, vomiting, abdominal pain, diarrhea and blood in stool.  Endocrine: Negative for cold intolerance, heat intolerance, polydipsia, polyphagia and polyuria.  Genitourinary: Negative for dysuria, urgency, frequency, hematuria, flank pain and difficulty urinating.  Musculoskeletal: Negative for myalgias, back pain, joint swelling and arthralgias.  Skin: Negative for pallor and rash.  Neurological: Negative for dizziness, speech difficulty, weakness and headaches.  Hematological: Negative for  adenopathy. Does not bruise/bleed easily.  Psychiatric/Behavioral: Negative for confusion and sleep disturbance. The patient is not nervous/anxious.      PE; Blood pressure 134/77, pulse 82, temperature 99.2 F (37.3 C), temperature source Temporal, resp. rate 18, height 5\' 10"  (1.778 m), weight 251 lb (113.853 kg), SpO2 97.00%. Gen: Alert, well appearing, obese white male.  Patient is oriented to person, place, time, and situation. AFFECT: pleasant, lucid thought and speech. ENT: Ears: EACs clear, normal epithelium.  TMs with good light reflex and landmarks bilaterally.  Eyes: no injection, icteris, swelling, or exudate.  EOMI, PERRLA. Nose: no drainage or turbinate edema/swelling.  No injection or focal lesion.  Mouth: lips without lesion/swelling.  Oral mucosa pink and moist.  Dentition intact and without obvious caries or gingival swelling.  Oropharynx without erythema, exudate, or swelling.  Neck: supple/nontender.  No LAD, mass, or TM.   CV: RRR, no m/r/g.  Peripheral pulses 2+ bilat in UE's and LE's. LUNGS: CTA bilat, nonlabored resps, good aeration in all lung fields. ABD: soft, NT, ND, BS normal.  No hepatospenomegaly or mass.  No bruits. EXT: no clubbing, cyanosis, or edema.  Musculoskeletal: no joint swelling, erythema, warmth, or tenderness.  ROM of all joints intact. Skin - no sores or suspicious lesions or rashes or color changes  Pertinent labs:  None today  ASSESSMENT AND PLAN:   Erectile dysfunction Suspect that this is largely psychogenic, but also somewhat related to smoking and his obesity. Discussed diet/exercise/wt loss strategies and smoking cessation strategies. No meds.  Tobacco dependence Encouraged cessation, discussed starting low-dose nicotine patch + use nicotine gum or lozenge prn. Ween down after 30d max.  Health maintenance examination Reviewed age and gender appropriate health maintenance issues (prudent diet, regular exercise, health risks of  tobacco and excessive alcohol, use of seatbelts, fire alarms in home, use of sunscreen).  Also reviewed age and gender appropriate health screening as well as vaccine recommendations. He declined flu vaccine today. He'll return when fasting to get screening BMET and FLP.   An After Visit Summary was printed and given to the patient.  FOLLOW UP:  Return for prn for o/v.  Make lab appt at pt's convenience (fasting).

## 2012-10-27 ENCOUNTER — Other Ambulatory Visit: Payer: 59

## 2012-10-29 ENCOUNTER — Ambulatory Visit (INDEPENDENT_AMBULATORY_CARE_PROVIDER_SITE_OTHER): Payer: 59 | Admitting: Family Medicine

## 2012-10-29 ENCOUNTER — Encounter: Payer: Self-pay | Admitting: Family Medicine

## 2012-10-29 ENCOUNTER — Other Ambulatory Visit: Payer: 59

## 2012-10-29 VITALS — BP 126/74 | HR 97 | Temp 99.0°F | Resp 20 | Ht 70.0 in | Wt 251.0 lb

## 2012-10-29 DIAGNOSIS — J209 Acute bronchitis, unspecified: Secondary | ICD-10-CM

## 2012-10-29 DIAGNOSIS — J029 Acute pharyngitis, unspecified: Secondary | ICD-10-CM

## 2012-10-29 DIAGNOSIS — B349 Viral infection, unspecified: Secondary | ICD-10-CM

## 2012-10-29 DIAGNOSIS — B9789 Other viral agents as the cause of diseases classified elsewhere: Secondary | ICD-10-CM

## 2012-10-29 MED ORDER — METHYLPREDNISOLONE ACETATE 80 MG/ML IJ SUSP
80.0000 mg | Freq: Once | INTRAMUSCULAR | Status: AC
  Administered 2012-10-29: 80 mg via INTRAMUSCULAR

## 2012-10-29 MED ORDER — HYDROCODONE-HOMATROPINE 5-1.5 MG/5ML PO SYRP: ORAL_SOLUTION | ORAL | Status: DC

## 2012-10-29 NOTE — Progress Notes (Signed)
OFFICE NOTE  10/29/2012  CC:  Chief Complaint  Patient presents with  . Sore Throat  . Headache  . Nasal Congestion     HPI: Patient is a 27 y.o. Caucasian male who is here for ST, HA, nasal congestion. Onset about 48h ago with ST, next day with lots of sneezing, cough, chest tightness/feeling of SOB---has used his inhaler last 2d. ST better since first day--now just hurts occ after coughing a lot. Dayquil and nyquil being used.   This morning he woke up with chills/subjective fever, body fatigue, a little achy in back/stomach.  No n/v. No tylenol or motrin today.   Pertinent PMH:  Past Medical History  Diagnosis Date  . Asthma childhood    No albuterol requirement in years, then acute flare 02/2012  . Allergic rhinitis   . Plantar fasciitis     [employer] orth[employer] helping  . Headache(784.0) 12/28/2011  . Tobacco abuse 12/28/2011  . Poor concentration 12/31/2011  . Herpes zoster 11/2010   Past surgical, social, and family history reviewed and no changes noted since last office visit.  MEDS:  Outpatient Prescriptions Prior to Visit  Medication Sig Dispense Refill  . albuterol (VENTOLIN HFA) 108 (90 BASE) MCG/ACT inhaler 1-2 puffs q4h prn chest tightness/wheezing/shortness of breath  1 Inhaler  0  . calcium carbonate (TUMS - DOSED IN MG ELEMENTAL CALCIUM) 500 MG chewable tablet Chew 6 tablets by mouth daily.        Marland Kitchen acetaminophen (TYLENOL) 500 MG tablet Take 1,000 mg by mouth every 6 (six) hours as needed.      Marland Kitchen omeprazole (PRILOSEC) 20 MG capsule Take 1 capsule (20 mg total) by mouth daily.  90 capsule  1   No facility-administered medications prior to visit.    PE: Blood pressure 126/74, pulse 97, temperature 99 F (37.2 C), temperature source Temporal, resp. rate 20, height 5\' 10"  (1.778 m), weight 251 lb (113.853 kg), SpO2 97.00%. VS: noted--normal. Gen: alert, NAD, NONTOXIC APPEARING. HEENT: eyes without injection, drainage, or swelling.  Ears: EACs clear, TMs with  normal light reflex and landmarks.  Nose: Clear rhinorrhea, with some dried, crusty exudate adherent to mildly injected mucosa.  No purulent d/c.  No paranasal sinus TTP.  No facial swelling.  Throat and mouth without focal lesion.  No pharyngial swelling, erythema, or exudate.   Neck: supple, no LAD.   LUNGS: CTA bilat, nonlabored resps, some post-exhalation coughing noted.   CV: RRR, no m/r/g. EXT: no c/c/e SKIN: no rash  LAB: rapid strep today was NEG  IMPRESSION AND PLAN:  Viral syndrome With some mild asthma flare/RAD. Depo-medrol 80mg  IM today in office. Continue albuterol 2 puffs q4h prn.  Hycodan rx'd today, no RF. Use OTC generic robitussin DM OR OTC MUCINEX DM. Stop dayquil/nyquil. Use OTC nasal saline spray: 2-3 sprays in each nostril 2-3 times a day to irrigate, moisten, and clear your nasal passages.     Note excusing him from work today and tomorrow was given.  FOLLOW UP: prn

## 2012-10-29 NOTE — Assessment & Plan Note (Addendum)
With some mild asthma flare/RAD. Depo-medrol 80mg  IM today in office. Continue albuterol 2 puffs q4h prn.  Hycodan rx'd today, no RF. Use OTC generic robitussin DM OR OTC MUCINEX DM. Stop dayquil/nyquil. Use OTC nasal saline spray: 2-3 sprays in each nostril 2-3 times a day to irrigate, moisten, and clear your nasal passages.

## 2012-10-29 NOTE — Patient Instructions (Signed)
Use OTC generic robitussin DM OR OTC MUCINEX DM. Stop dayquil/nyquil. Use OTC nasal saline spray: 2-3 sprays in each nostril 2-3 times a day to irrigate, moisten, and clear your nasal passages.

## 2012-10-29 NOTE — Addendum Note (Signed)
Addended by: Eulah Pont on: 10/29/2012 09:47 AM   Modules accepted: Orders

## 2012-10-31 ENCOUNTER — Other Ambulatory Visit (INDEPENDENT_AMBULATORY_CARE_PROVIDER_SITE_OTHER): Payer: 59

## 2012-10-31 DIAGNOSIS — N529 Male erectile dysfunction, unspecified: Secondary | ICD-10-CM

## 2012-10-31 DIAGNOSIS — Z0000 Encounter for general adult medical examination without abnormal findings: Secondary | ICD-10-CM

## 2012-10-31 DIAGNOSIS — E669 Obesity, unspecified: Secondary | ICD-10-CM

## 2012-10-31 DIAGNOSIS — F17200 Nicotine dependence, unspecified, uncomplicated: Secondary | ICD-10-CM

## 2012-10-31 LAB — LIPID PANEL
HDL: 57.4 mg/dL (ref >39.00)
LDL Cholesterol: 95 mg/dL (ref 0–99)
Total CHOL/HDL Ratio: 3
Triglycerides: 72 mg/dL (ref 0.0–149.0)
VLDL: 14.4 mg/dL (ref 0.0–40.0)

## 2012-10-31 LAB — BASIC METABOLIC PANEL
CO2: 30 mEq/L (ref 19–32)
Calcium: 9.8 mg/dL (ref 8.4–10.5)
Creatinine, Ser: 0.9 mg/dL (ref 0.4–1.5)
GFR: 105.69 mL/min (ref >60.00)
Sodium: 140 mEq/L (ref 135–145)

## 2012-10-31 NOTE — Progress Notes (Signed)
Labs only

## 2017-01-24 ENCOUNTER — Ambulatory Visit: Payer: Self-pay | Admitting: Emergency Medicine

## 2017-01-24 VITALS — BP 140/98 | HR 82 | Temp 98.5°F | Resp 18

## 2017-01-24 DIAGNOSIS — J069 Acute upper respiratory infection, unspecified: Secondary | ICD-10-CM

## 2017-01-24 DIAGNOSIS — B9789 Other viral agents as the cause of diseases classified elsewhere: Secondary | ICD-10-CM

## 2017-01-24 DIAGNOSIS — T148XXA Other injury of unspecified body region, initial encounter: Secondary | ICD-10-CM

## 2017-01-24 MED ORDER — METHOCARBAMOL 500 MG PO TABS: 500.0000 mg | ORAL_TABLET | Freq: Four times a day (QID) | ORAL | 0 refills | Status: DC

## 2017-01-24 MED ORDER — DICLOFENAC SODIUM 75 MG PO TBEC: 75.0000 mg | DELAYED_RELEASE_TABLET | Freq: Two times a day (BID) | ORAL | 0 refills | Status: DC

## 2017-01-24 MED ORDER — BENZONATATE 200 MG PO CAPS: 200.0000 mg | ORAL_CAPSULE | Freq: Three times a day (TID) | ORAL | 0 refills | Status: DC | PRN

## 2017-01-24 NOTE — Patient Instructions (Addendum)
Muscle Strain  For your cough, I recommend Mucinex DM for cough, tylenol for pain but no ibuprofen, Advil, naproxen, BC powders or any OTC NSAIDS. For cough, I prescribed Tessalon, take one tablet every 8 hours as needed, for pain I prescribed Diclofenac, take one tablet twice a day and Robaxin, one tablet 4 times a day. Robaxin may cause drowsiness, do not drive or opperate machinery until you know how this medicine affects you.  We do not have labs or ultrasound at our location, if your symptoms worsen, fail to resolve, develop a fever, nausea, vomiting, or other concerning symptoms, go to the ER right away,  A muscle strain (pulled muscle) happens when a muscle is stretched beyond normal length. It happens when a sudden, violent force stretches your muscle too far. Usually, a few of the fibers in your muscle are torn. Muscle strain is common in athletes. Recovery usually takes 1-2 weeks. Complete healing takes 5-6 weeks. Follow these instructions at home:  Follow the PRICE method of treatment to help your injury get better. Do this the first 2-3 days after the injury: ? Protect. Protect the muscle to keep it from getting injured again. ? Rest. Limit your activity and rest the injured body part. ? Ice. Put ice in a plastic bag. Place a towel between your skin and the bag. Then, apply the ice and leave it on from 15-20 minutes each hour. After the third day, switch to moist heat packs. ? Compression. Use a splint or elastic bandage on the injured area for comfort. Do not put it on too tightly. ? Elevate. Keep the injured body part above the level of your heart.  Only take medicine as told by your doctor.  Warm up before doing exercise to prevent future muscle strains. Contact a doctor if:  You have more pain or puffiness (swelling) in the injured area.  You feel numbness, tingling, or notice a loss of strength in the injured area. This information is not intended to replace advice given to  you by your health care provider. Make sure you discuss any questions you have with your health care provider. Document Released: 10/18/2007 Document Revised: 06/16/2015 Document Reviewed: 08/07/2012 Elsevier Interactive Patient Education  2017 [employer]

## 2017-01-24 NOTE — Progress Notes (Signed)
Subjective:     Cesar RoysMichael R Huynh is a 32 y.o. male who presents for evaluation of abdominal pain. The pain is described as dull, pressure-like and stabbing, and is 9/10 in intensity. Pain is located in the RUQ without radiation. Onset was 3 days ago. Symptoms have been gradually worsening since. Aggravating factors: movement and recumbency.  Alleviating factors: acetaminophen and NSAIDs. Associated symptoms: none. The patient denies anorexia, belching, chills, constipation, diarrhea, fever, myalgias, nausea, sweats, vomiting and pain is not worsened with diet. The following portions of the patient's history were reviewed and updated as appropriate: allergies and current medications. Reports similar symptoms 2 years ago following a URI with cough, diagnosed with muscle strain. Reports he currently has a URI and symptoms started after coughing and felt a pull in his stomach.   Review of Systems Pertinent items are noted in HPI.    Objective:    BP (!) 140/98 (BP Location: Right Arm, Patient Position: Sitting, Cuff Size: Large)   Pulse 82   Temp 98.5 F (36.9 C) (Oral)   Resp 18   SpO2 96%     Physical Exam  Constitutional: He is well-developed, well-nourished, and in no distress. No distress.  HENT:  Head: Normocephalic and atraumatic.  Cardiovascular: Normal rate.  Pulmonary/Chest: Effort normal and breath sounds normal.  Abdominal: Soft. Bowel sounds are normal. There is tenderness in the right upper quadrant. There is no rigidity, no rebound, no guarding, no CVA tenderness, no tenderness at McBurney's point and negative Murphy's sign.  striae noted to the abdomen, pain primarily located with palpation of the right lateral ribs.  Skin: He is not diaphoretic.  Nursing note and vitals reviewed.  exam Assessment:    Muscle strain, viral URI    Plan:    Physical exam reassuring, doubt surgical abdomen, more likely muscle strain, strict follow up guidelines provided, recommend going to  the Er anytime symptoms worsen.   1. Muscle strain  - methocarbamol (ROBAXIN) 500 MG tablet; Take 1 tablet (500 mg total) by mouth 4 (four) times daily.  Dispense: 40 tablet; Refill: 0 - diclofenac (VOLTAREN) 75 MG EC tablet; Take 1 tablet (75 mg total) by mouth 2 (two) times daily.  Dispense: 20 tablet; Refill: 0  2. Viral URI with cough  - benzonatate (TESSALON) 200 MG capsule; Take 1 capsule (200 mg total) by mouth 3 (three) times daily as needed for cough.  Dispense: 20 capsule; Refill: 0

## 2017-01-27 ENCOUNTER — Telehealth: Payer: Self-pay

## 2017-01-27 NOTE — Telephone Encounter (Signed)
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  Per provider - Patient will need to be Evaluated and Treated at the nearest Emergency Department for further evaluation. Patient need imaging done.  Patient stated he understood and had no other questions.

## 2017-05-15 ENCOUNTER — Emergency Department
Admission: EM | Admit: 2017-05-15 | Discharge: 2017-05-15 | Disposition: A | Discharge status: Home / Self Care | Payer: Self-pay | Source: Home / Self Care | Attending: Emergency Medicine | Admitting: Emergency Medicine

## 2017-05-15 DIAGNOSIS — E041 Nontoxic single thyroid nodule: Secondary | ICD-10-CM

## 2017-05-15 DIAGNOSIS — J351 Hypertrophy of tonsils: Secondary | ICD-10-CM

## 2017-05-15 DIAGNOSIS — J039 Acute tonsillitis, unspecified: Secondary | ICD-10-CM

## 2017-05-15 LAB — POCT RAPID STREP A (OFFICE): RAPID STREP A SCREEN: NEGATIVE

## 2017-05-15 MED ORDER — AZITHROMYCIN 250 MG PO TABS: ORAL_TABLET | ORAL | 0 refills | Status: DC

## 2017-05-15 NOTE — ED Provider Notes (Signed)
[facility]    CSN: [id number] Arrival date & time: 05/15/17  1531     History   Chief Complaint No chief complaint on file.   HPI Cesar Young is a 32 y.o. male.  Patient enters concerned because his tonsils are large.  He denies any pain or fever.  He was around an infant with a respiratory infection but he does not feel ill.  He states he has 1 out of 10 pain. HPI  Past Medical History:  Diagnosis Date  . Allergic rhinitis   . Asthma childhood   No albuterol requirement in years, then acute flare 02/2012  . Headache(784.0) 12/28/2011  . Herpes zoster 11/2010  . Plantar fasciitis    [employer] orthotics helping  . Poor concentration 12/31/2011  . Tobacco abuse 12/28/2011    Patient Active Problem List   Diagnosis Date Noted  . Acute bronchitis 10/29/2012  . Viral syndrome 10/29/2012  . Health maintenance examination 10/23/2012  . Erectile dysfunction 10/23/2012  . Tobacco dependence 10/23/2012    Past Surgical History:  Procedure Laterality Date  . ADENOIDECTOMY  1993  . WISDOM TOOTH EXTRACTION  2009       Home Medications    Prior to Admission medications   Medication Sig Start Date End Date Taking? Authorizing Provider  acetaminophen (TYLENOL) 500 MG tablet Take 1,000 mg by mouth every 6 (six) hours as needed.    [provider]  azithromycin (ZITHROMAX) 250 MG tablet Take 2 tabs PO x 1 dose, then 1 tab PO QD x 4 days 05/15/17   Collene Gobble, MD  benzonatate (TESSALON) 200 MG capsule Take 1 capsule (200 mg total) by mouth 3 (three) times daily as needed for cough. 01/24/17   Dorena Bodo, NP  calcium carbonate (TUMS - DOSED IN MG ELEMENTAL CALCIUM) 500 MG chewable tablet Chew 6 tablets by mouth daily.      [provider]  diclofenac (VOLTAREN) 75 MG EC tablet Take 1 tablet (75 mg total) by mouth 2 (two) times daily. 01/24/17   Dorena Bodo, NP  methocarbamol (ROBAXIN) 500 MG tablet Take 1 tablet (500 mg total) by mouth  4 (four) times daily. 01/24/17   Dorena Bodo, NP  omeprazole (PRILOSEC) 20 MG capsule Take 1 capsule (20 mg total) by mouth daily. 09/07/11 09/06/12  McGowenMaryjean Morn, MD    Family History Family History  Problem Relation Age of Onset  . GER disease Mother   . Cancer Father        thyroid cancer ("non-familial type"  . Depression Father     Social History Social History   Tobacco Use  . Smoking status: Current Some Day Smoker    Packs/day: 0.50    Years: 3.00    Pack years: 1.50    Types: Cigarettes    Last attempt to quit: 08/31/2011    Years since quitting: 5.7  . Smokeless tobacco: Current User  Substance Use Topics  . Alcohol use: Yes    Comment: occasionally 1 or 2 beers every couple months  . Drug use: No     Allergies   Cephalosporins and Penicillins   Review of Systems Review of Systems  Constitutional: Negative.   HENT: Positive for sore throat. Negative for trouble swallowing and voice change.   Eyes: Negative.   Respiratory: Negative.   Cardiovascular: Negative.   Gastrointestinal:       Patient has a history of reflux currently treated with Pepcid.  Endocrine: Negative.  Physical Exam Triage Vital Signs ED Triage Vitals  Enc Vitals Group     BP      Pulse      Resp      Temp      Temp src      SpO2      Weight      Height      Head Circumference      Peak Flow      Pain Score      Pain Loc      Pain Edu?      Excl. in GC?    No data found.  Updated Vital Signs There were no vitals taken for this visit.  Visual Acuity Right Eye Distance:   Left Eye Distance:   Bilateral Distance:    Right Eye Near:   Left Eye Near:    Bilateral Near:     Physical Exam  Constitutional: He appears well-developed and well-nourished.  HENT:  Right Ear: External ear normal.  Left Ear: External ear normal.  Tonsils are 3+ enlarged.  There may be some superior exudate on the left.  TMs were normal.  Suspicion of a 1 x 1-1/2 cm left  thyroid nodule.  Cardiovascular: Normal rate.  No murmur heard. Pulmonary/Chest: Effort normal and breath sounds normal.  Abdominal: Soft.     UC Treatments / Results  Labs (all labs ordered are listed, but only abnormal results are displayed) Labs Reviewed  STREP A DNA PROBE  POCT RAPID STREP A (OFFICE)    EKG None Radiology No results found.  Procedures Procedures (including critical care time)  Medications Ordered in UC Medications - No data to display   Initial Impression / Assessment and Plan / UC Course  I have reviewed the triage vital signs and the nursing notes.  Pertinent labs & imaging results that were available during my care of the patient were reviewed by me and considered in my medical decision making (see chart for details).    Patient has significant tonsillar enlargement will check strep test today.  He also has a suspicion of a left-sided nodule.  ENT referral was advised.  He said he would follow through with ENT.  His father was diagnosed with thyroid cancer about 10 years ago and so he is very willing to see the ENT physician.  His tonsils are also symmetrically enlarged and I will appreciate the opinion of the ear nose and throat physicians.  He will be treated with a Z-Pak.  Final Clinical Impressions(s) / UC Diagnoses   Final diagnoses:  Acute tonsillitis, unspecified etiology  Left thyroid nodule  Tonsillar enlargement    ED Discharge Orders        Ordered    azithromycin (ZITHROMAX) 250 MG tablet     05/15/17 1727     Please make an appointment to be seen at ear nose and throat as soon as possible. We will call you with culture results. Take Z-Pak as instructed. On examination I am concerned about a possible thyroid nodule and with your family history of thyroid cancer this needs to be checked as soon as possible. The ear nose and throat doctor will also evaluate the tonsillar enlargement. Controlled Substance Prescriptions [place]  Controlled Substance Registry consulted? Not Applicable   Collene GobbleDaub, Steven A, MD 05/15/17 2008

## 2017-05-15 NOTE — Discharge Instructions (Addendum)
Please make an appointment to be seen at ear nose and throat as soon as possible. We will call you with culture results. Take Z-Pak as instructed. On examination I am concerned about a possible thyroid nodule and with your family history of thyroid cancer this needs to be checked as soon as possible. The ear nose and throat doctor will also evaluate the tonsillar enlargement

## 2017-05-16 ENCOUNTER — Telehealth: Payer: Self-pay

## 2017-05-16 LAB — STREP A DNA PROBE: GROUP A STREP PROBE: NOT DETECTED

## 2017-05-16 NOTE — Telephone Encounter (Signed)
Spoke with patient, feeling better, will follow up as needed.  Lab results given.

## 2017-08-22 HISTORY — PX: BIOPSY THYROID: PRO38

## 2017-09-05 ENCOUNTER — Encounter: Payer: Self-pay | Admitting: *Deleted

## 2017-09-05 ENCOUNTER — Emergency Department
Admission: EM | Admit: 2017-09-05 | Discharge: 2017-09-05 | Disposition: A | Discharge status: Home / Self Care | Payer: Self-pay | Source: Home / Self Care | Attending: Family Medicine | Admitting: Family Medicine

## 2017-09-05 ENCOUNTER — Other Ambulatory Visit: Payer: Self-pay

## 2017-09-05 DIAGNOSIS — R0989 Other specified symptoms and signs involving the circulatory and respiratory systems: Secondary | ICD-10-CM

## 2017-09-05 DIAGNOSIS — J351 Hypertrophy of tonsils: Secondary | ICD-10-CM

## 2017-09-05 DIAGNOSIS — J029 Acute pharyngitis, unspecified: Secondary | ICD-10-CM

## 2017-09-05 DIAGNOSIS — R6889 Other general symptoms and signs: Secondary | ICD-10-CM

## 2017-09-05 LAB — POCT RAPID STREP A (OFFICE): Rapid Strep A Screen: NEGATIVE

## 2017-09-05 MED ORDER — PREDNISONE 50 MG PO TABS: 50.0000 mg | ORAL_TABLET | Freq: Every day | ORAL | 0 refills | Status: DC

## 2017-09-05 NOTE — ED Provider Notes (Signed)
[facility]    CSN: [id number] Arrival date & time: 09/05/17  1713     History   Chief Complaint Chief Complaint  Patient presents with  . Throat Tightness    HPI Cesar Young is a 32 y.o. male.   HPI  Cesar Young is a 32 y.o. male presenting to UC with c/o intermittent tightness sensation in his throat that started about 5-6 days ago. He had similar symptoms in May of this year. He was treated for potential bacterial infection but his strep test came back negative at that time. He was advised to f/u with ENT for a possible nodule on his thyroid and hx of thyroid cancer with his father. Pt was [facility] due to their request for a referral. Pt does not currently have medical insurance but will soon due to starting a new job.   Today he denies fever, chills, cough, congestion, SOB. No weight loss. He does have occasional palpitations but relates that to anxiety and consumption of energy drinks, which he notes he is trying to cut back on.  Denies chest pain or palpitations today.  Past Medical History:  Diagnosis Date  . Allergic rhinitis   . Asthma childhood   No albuterol requirement in years, then acute flare 02/2012  . Headache(784.0) 12/28/2011  . Herpes zoster 11/2010  . Plantar fasciitis    [employer] orthotics helping  . Poor concentration 12/31/2011  . Tobacco abuse 12/28/2011    Patient Active Problem List   Diagnosis Date Noted  . Acute bronchitis 10/29/2012  . Viral syndrome 10/29/2012  . Health maintenance examination 10/23/2012  . Erectile dysfunction 10/23/2012  . Tobacco dependence 10/23/2012    Past Surgical History:  Procedure Laterality Date  . ADENOIDECTOMY  1993  . WISDOM TOOTH EXTRACTION  2009       Home Medications    Prior to Admission medications   Medication Sig Start Date End Date Taking? Authorizing Provider  Famotidine-Ca Carb-Mag Hydrox (PEPCID COMPLETE PO) Take by  mouth.   Yes [provider]  predniSONE (DELTASONE) 50 MG tablet Take 1 tablet (50 mg total) by mouth daily with breakfast. 09/05/17   Lurene ShadowPhelps, Vivaan Helseth O, PA-C    Family History Family History  Problem Relation Age of Onset  . GER disease Mother   . Cancer Father        thyroid cancer ("non-familial type"  . Depression Father     Social History Social History   Tobacco Use  . Smoking status: Current Some Day Smoker    Packs/day: 0.50    Years: 3.00    Pack years: 1.50    Types: Cigarettes    Last attempt to quit: 08/31/2011    Years since quitting: 6.0  . Smokeless tobacco: Current User  Substance Use Topics  . Alcohol use: Yes    Comment: occasionally 1 or 2 beers every couple months  . Drug use: No     Allergies   Cephalosporins and Penicillins   Review of Systems Review of Systems  Constitutional: Negative for chills and fever.  HENT: Positive for sore throat and trouble swallowing (irritation/feels tight). Negative for congestion, ear pain and voice change.   Respiratory: Negative for cough and shortness of breath.   Cardiovascular: Negative for chest pain and palpitations.  Gastrointestinal: Negative for abdominal pain, diarrhea, nausea and vomiting.  Musculoskeletal: Negative for arthralgias, back pain and myalgias.  Skin: Negative  for rash.     Physical Exam Triage Vital Signs ED Triage Vitals [09/05/17 1739]  Enc Vitals Group     BP (!) 150/86     Pulse Rate 86     Resp 20     Temp      Temp src      SpO2 97 %     Weight 286 lb (129.7 kg)     Height      Head Circumference      Peak Flow      Pain Score 0     Pain Loc      Pain Edu?      Excl. in GC?    No data found.  Updated Vital Signs BP (!) 150/86 (BP Location: Right Arm)   Pulse 86   Resp 20   Wt 286 lb (129.7 kg)   SpO2 97%   BMI 41.04 kg/m   Visual Acuity Right Eye Distance:   Left Eye Distance:   Bilateral Distance:    Right Eye Near:   Left Eye Near:      Bilateral Near:     Physical Exam  Constitutional: He is oriented to person, place, and time. He appears well-developed and well-nourished. No distress.  HENT:  Head: Normocephalic and atraumatic.  Right Ear: Tympanic membrane normal.  Left Ear: Tympanic membrane normal.  Nose: Nose normal.  Mouth/Throat: Uvula is midline and mucous membranes are normal. Posterior oropharyngeal edema and posterior oropharyngeal erythema present. Tonsils are 3+ on the right. Tonsils are 3+ on the left.  Bilateral enlarged tonsils (chronic per pt and mother)  Eyes: EOM are normal.  Neck: Normal range of motion. Neck supple. No thyromegaly present.  No obvious thyromegaly or nodule palpated  Cardiovascular: Normal rate and regular rhythm.  Pulmonary/Chest: Effort normal and breath sounds normal. No stridor. No respiratory distress. He has no wheezes. He has no rales.  Musculoskeletal: Normal range of motion.  Neurological: He is alert and oriented to person, place, and time.  Skin: Skin is warm and dry. He is not diaphoretic.  Psychiatric: He has a normal mood and affect. His behavior is normal.  Nursing note and vitals reviewed.    UC Treatments / Results  Labs (all labs ordered are listed, but only abnormal results are displayed) Labs Reviewed  POCT RAPID STREP A (OFFICE)    EKG None  Radiology No results found.  Procedures Procedures (including critical care time)  Medications Ordered in UC Medications - No data to display  Initial Impression / Assessment and Plan / UC Course  I have reviewed the triage vital signs and the nursing notes.  Pertinent labs & imaging results that were available during my care of the patient were reviewed by me and considered in my medical decision making (see chart for details).     Strep: negative Will try trial of prednisone Encouraged to get U/S of thyroid tomorrow at [facility], order placed. F/u with ENT and PCP Pt info packet  provided.  Final Clinical Impressions(s) / UC Diagnoses   Final diagnoses:  Throat tightness  Sore throat  Enlarged tonsils     Discharge Instructions      Please establish care with a family doctor for ongoing healthcare needs and recheck of symptoms next week.  Please call to schedule an appointment with Ear Nose and Throat for further evaluation of throat/neck tightness and to discuss possible removal of tonsils.   You have an order in the [facility]  system to have an Ultrasound of your Thyroid performed at [facility] tomorrow. Please call the number given today to schedule an appointment.     ED Prescriptions    Medication Sig Dispense Auth. Provider   predniSONE (DELTASONE) 50 MG tablet Take 1 tablet (50 mg total) by mouth daily with breakfast. 5 tablet Lurene ShadowPhelps, Deleah Tison O, PA-C     Controlled Substance Prescriptions [place] Controlled Substance Registry consulted? Not Applicable   Rolla Platehelps, Otillia Cordone O, PA-C 09/05/17 1914

## 2017-09-05 NOTE — ED Triage Notes (Signed)
Patient c/o tightness in his throat and previously told he may have a nodule on his thyroid. He has a family h/o thyroid CA.  He also has concerns for ADHD and bipolar disorder. Encouraged home to see a family doctor for evaluation and treatment for this matter.

## 2017-09-05 NOTE — Discharge Instructions (Signed)
°  Please establish care with a family doctor for ongoing healthcare needs and recheck of symptoms next week.  Please call to schedule an appointment with Ear Nose and Throat for further evaluation of throat/neck tightness and to discuss possible removal of tonsils.   You have an order in the [facility] system to have an Ultrasound of your Thyroid performed at [facility] tomorrow. Please call the number given today to schedule an appointment.

## 2017-09-06 ENCOUNTER — Ambulatory Visit (HOSPITAL_BASED_OUTPATIENT_CLINIC_OR_DEPARTMENT_OTHER): Admission: RE | Admit: 2017-09-06 | Payer: Self-pay | Source: Ambulatory Visit

## 2017-09-10 ENCOUNTER — Ambulatory Visit (HOSPITAL_BASED_OUTPATIENT_CLINIC_OR_DEPARTMENT_OTHER)
Admission: RE | Admit: 2017-09-10 | Discharge: 2017-09-10 | Disposition: A | Discharge status: Home / Self Care | Payer: Self-pay | Source: Ambulatory Visit | Attending: Emergency Medicine | Admitting: Emergency Medicine

## 2017-09-10 DIAGNOSIS — E042 Nontoxic multinodular goiter: Secondary | ICD-10-CM | POA: Insufficient documentation

## 2017-09-11 ENCOUNTER — Telehealth: Payer: Self-pay | Admitting: Emergency Medicine

## 2017-09-12 ENCOUNTER — Telehealth: Payer: Self-pay | Admitting: Emergency Medicine

## 2017-09-12 NOTE — Telephone Encounter (Signed)
Called and left message on referral line, my name and number, patients name and number, reason, etc.  Called patient back and advised him if he did not hear by tomorrow to call me back and I would follow up.

## 2017-09-24 ENCOUNTER — Other Ambulatory Visit: Payer: Self-pay | Admitting: Internal Medicine

## 2017-09-24 DIAGNOSIS — E041 Nontoxic single thyroid nodule: Secondary | ICD-10-CM

## 2017-10-15 ENCOUNTER — Ambulatory Visit
Admission: RE | Admit: 2017-10-15 | Discharge: 2017-10-15 | Disposition: A | Discharge status: Home / Self Care | Payer: Self-pay | Source: Ambulatory Visit | Attending: Internal Medicine | Admitting: Internal Medicine

## 2017-10-15 ENCOUNTER — Other Ambulatory Visit (HOSPITAL_COMMUNITY)
Admission: RE | Admit: 2017-10-15 | Discharge: 2017-10-15 | Disposition: A | Discharge status: Home / Self Care | Payer: Self-pay | Source: Ambulatory Visit | Attending: Student | Admitting: Student

## 2017-10-15 DIAGNOSIS — E041 Nontoxic single thyroid nodule: Secondary | ICD-10-CM | POA: Insufficient documentation

## 2017-11-29 ENCOUNTER — Ambulatory Visit: Payer: Self-pay | Admitting: Family Medicine

## 2017-12-05 ENCOUNTER — Ambulatory Visit: Payer: Self-pay | Admitting: Family Medicine

## 2017-12-05 ENCOUNTER — Encounter: Payer: Self-pay | Admitting: Family Medicine

## 2017-12-05 VITALS — BP 148/77 | HR 95 | Temp 98.4°F | Resp 16 | Ht 70.0 in | Wt 304.0 lb

## 2017-12-05 DIAGNOSIS — F39 Unspecified mood [affective] disorder: Secondary | ICD-10-CM

## 2017-12-05 DIAGNOSIS — E041 Nontoxic single thyroid nodule: Secondary | ICD-10-CM

## 2017-12-05 DIAGNOSIS — F909 Attention-deficit hyperactivity disorder, unspecified type: Secondary | ICD-10-CM

## 2017-12-05 DIAGNOSIS — F411 Generalized anxiety disorder: Secondary | ICD-10-CM

## 2017-12-05 MED ORDER — METHYLPHENIDATE HCL ER 27 MG PO TB24: ORAL_TABLET | ORAL | 0 refills | Status: DC

## 2017-12-05 NOTE — Progress Notes (Signed)
Office Note 12/05/2017  CC:  Chief Complaint  Patient presents with  . Establish Care    No previous PCP  . ADHD  . Referral    to Psych for Bi-polar?    HPI:  Cesar Young is a 32 y.o. male who is here to re-establish care.  I did see him in the past, Interim primary MD: none.  Has seen an endocrinologist for thyroid nodule in summer/fall 2019. Old records in EPIC/[facility] EMR were reviewed prior to or during today's visit.  Wants to discuss some psych sx's:   Biggest things are inability to focus (problem since elementary school age), hard to keep on task, forgetful, can't sit still/fidgets all the time, poor organizational skills, talking too much in social situations, inpatient/can't wait, interrupts others. Mood lately has been "mostly good".  Father with illness/hospitalized recently.  Recent anxiety attack occurred when he was going to hospital to visit him. Big life changes have led to some mild depression but nothing longer than a couple weeks and nothing severe. For example while waiting a week for his thyroid biopsy results to come back he felt particularly down/hopeless. No hx of SI, HI, or hospitalization.  Marland Kitchen. He does have periods of days in a row in which he has excessive energy and decreased need for sleep, excessive projects all started at the same time.  Not really irritable or doing risky behaviors during these times. No hx of hallucinations or delusions.  He has never been on medication for ADHD, mood, or anxiety.  Past Medical History:  Diagnosis Date  . Allergic rhinitis   . Asthma childhood   No albuterol requirement in years, then acute flare 02/2012  . ED (erectile dysfunction) 2014  . Headache(784.0) 12/28/2011  . Herpes zoster 11/2010  . Plantar fasciitis    [employer] orthotics helping  . Poor concentration 12/31/2011  . Thyroid nodule summer 2019   u/s -->2.1 cm-->bx neg.  Has f/u 03/2018.  . Tobacco abuse 12/28/2011    Past Surgical History:   Procedure Laterality Date  . ADENOIDECTOMY  1993  . BIOPSY THYROID  08/2017   NEG for malignancy  . WISDOM TOOTH EXTRACTION  2009    Family History  Problem Relation Age of Onset  . GER disease Mother   . Cancer Father        thyroid cancer ("non-familial type"  . Depression Father     Social History   Socioeconomic History  . Marital status: Single    Spouse name: Not on file  . Number of children: Not on file  . Years of education: Not on file  . Highest education level: Not on file  Occupational History  . Not on file  Social Needs  . Financial resource strain: Not on file  . Food insecurity:    Worry: Not on file    Inability: Not on file  . Transportation needs:    Medical: Not on file    Non-medical: Not on file  Tobacco Use  . Smoking status: Current Some Day Smoker    Packs/day: 0.50    Years: 9.00    Pack years: 4.50    Types: Cigarettes    Last attempt to quit: 08/31/2011    Years since quitting: 6.2  . Smokeless tobacco: Current User  Substance and Sexual Activity  . Alcohol use: Yes    Comment: occasionally 1 or 2 beers every couple months  . Drug use: No  . Sexual activity: Yes  Partners: Female  Lifestyle  . Physical activity:    Days per week: Not on file    Minutes per session: Not on file  . Stress: Not on file  Relationships  . Social connections:    Talks on phone: Not on file    Gets together: Not on file    Attends religious service: Not on file    Active member of club or organization: Not on file    Attends meetings of clubs or organizations: Not on file    Relationship status: Not on file  . Intimate partner violence:    Fear of current or ex partner: Not on file    Emotionally abused: Not on file    Physically abused: Not on file    Forced sexual activity: Not on file  Other Topics Concern  . Not on file  Social History Narrative   Single.  No children.   [employer] grad, college x 2 yrs ([employer] copy).    Currently working for [employer], plans on taking one class at [employer] soon in attempt to finish his associates of Arts degree--ultimately wants to get BA in Marketing at [employer].   Worked for [employer] x 3 yrs.   Smokes 1 pack cigs/day x 10 yrs as of 11/2017.     Occ alcohol--2-3 beers at a time--denies abuse.  No drug use.   No consistent exercise.  Diet is poor.    Outpatient Encounter Medications as of 12/05/2017  Medication Sig  . acetaminophen (TYLENOL) 500 MG tablet Take by mouth as needed.  . Famotidine-Ca Carb-Mag Hydrox (PEPCID COMPLETE PO) Take by mouth.  . methylphenidate 27 MG PO TB24 1 tab po qAM x 15d, then increase to 2 tabs po qAM  . [DISCONTINUED] predniSONE (DELTASONE) 50 MG tablet Take 1 tablet (50 mg total) by mouth daily with breakfast. (Patient not taking: Reported on 12/05/2017)   No facility-administered encounter medications on file as of 12/05/2017.     Allergies  Allergen Reactions  . Cephalosporins   . Penicillins     ROS Review of Systems  Constitutional: Negative for fever.  Cardiovascular: Negative for leg swelling.  Gastrointestinal: Negative for abdominal pain.  Genitourinary: Negative for dysuria, flank pain and urgency.  Musculoskeletal: Negative for joint swelling and neck pain.  Skin: Negative for rash.  Neurological: Negative for dizziness, tremors, weakness and numbness.  Psychiatric/Behavioral: Positive for decreased concentration and sleep disturbance. The patient is nervous/anxious and is hyperactive.     PE; Blood pressure (!) 148/77, pulse 95, temperature 98.4 F (36.9 C), temperature source Oral, resp. rate 16, height 5\' 10"  (1.778 m), weight (!) 304 lb (137.9 kg), SpO2 97 %. Talks constantly but not pressured speech.  Wt Readings from Last 2 Encounters:  12/05/17 (!) 304 lb (137.9 kg)  09/05/17 286 lb (129.7 kg)    Gen: alert, oriented x 4, affect pleasant.  Lucid thinking and conversation noted. He does talk  constantly and I have to interrupt him to run the interview HEENT: PERRLA, EOMI.   Neck: no LAD or mass.  I can feel a mildly enlarged, symmetric, non-tender thyroid gland.  No nodule is palpable. CV: RRR, no m/r/g LUNGS: CTA bilat, nonlabored. NEURO: no tremor or tics noted on observation.  Coordination intact. CN 2-12 grossly intact bilaterally, strength 5/5 in all extremeties.  No ataxia. Patellar and achilles DTRs all 2+, 1 beat clonus in each ankle.  Pertinent labs:  No results found for:  TSH Lab Results  Component Value Date   WBC 6.5 04/10/2008   HGB 15.3 04/10/2008   HCT 45.3 04/10/2008   MCV 90.0 04/10/2008   PLT 167 04/10/2008   Lab Results  Component Value Date   CREATININE 0.9 10/31/2012   BUN 11 10/31/2012   NA 140 10/31/2012   K 4.6 10/31/2012   CL 102 10/31/2012   CO2 30 10/31/2012   Lab Results  Component Value Date   ALT 15 04/10/2008   AST 27 04/10/2008   ALKPHOS 83 04/10/2008   BILITOT 1.2 04/10/2008   Lab Results  Component Value Date   CHOL 167 10/31/2012   Lab Results  Component Value Date   HDL 57.40 10/31/2012   Lab Results  Component Value Date   LDLCALC 95 10/31/2012   Lab Results  Component Value Date   TRIG 72.0 10/31/2012   Lab Results  Component Value Date   CHOLHDL 3 10/31/2012     ASSESSMENT AND PLAN:   New/re-establishing pt:  Will get endocrinologist's records.  1) Adult ADHD:  Plan: start methylphenidate ER 27mg , 1 qAM x 15d, then increase to 2 qAM if not feeling significant improvement in sx's.  Therapeutic expectations and side effect profile of medication discussed today.  Patient's questions answered.  2) Mood disorder: question of.  Pt is interested in psychiatry referral b/c of his worry about possibly having bipolar disorder.  I referred him to [facility] psychiatry today.  3) Thyroid nodule.  Fortunately bx a few months ago was NEG for malignancy. Will ask for endocrinologist's records.  An After Visit  Summary was printed and given to the patient.  Return in about 4 weeks (around 01/02/2018) for f/u adhd/mood.  Signed:  [name], MD           12/05/2017

## 2018-01-02 ENCOUNTER — Ambulatory Visit: Payer: Self-pay | Admitting: Family Medicine

## 2018-01-02 ENCOUNTER — Encounter: Payer: Self-pay | Admitting: Family Medicine

## 2018-01-02 VITALS — BP 146/79 | HR 85 | Resp 16 | Ht 70.0 in | Wt 300.0 lb

## 2018-01-02 DIAGNOSIS — F909 Attention-deficit hyperactivity disorder, unspecified type: Secondary | ICD-10-CM

## 2018-01-02 MED ORDER — AMPHETAMINE-DEXTROAMPHET ER 20 MG PO CP24: 20.0000 mg | ORAL_CAPSULE | ORAL | 0 refills | Status: DC

## 2018-01-02 NOTE — Progress Notes (Signed)
OFFICE VISIT  01/02/2018   CC:  Chief Complaint  Patient presents with  . Follow-up    ADHD     HPI:    Patient is a 32 y.o. Caucasian male who presents for 1 mo f/u adult ADD. Started concerta 27mg  qd last visit, with instructions for titration up to 54mg  qd.  Initially he felt significant improvement in focus/concentration, task completion, less fidgety, more calm inside. These very significant effects wore off after first dose and the med still helped but much less robustly.  Less anxiety. NO side effect. This med cost him 130$ out of pocket, so he decided NOT to increase his dose to 54 mg qd.  Reports ED since age 46.  Testosterone level normal in the past.  He is markedly overweight and he smokes. It has improved some since he cut back on smoking.  He was rx'd viagra in the past and it helped some but not quite adequately. He took a product at an adult novelty store once and it helped significantly.  Past Medical History:  Diagnosis Date  . Adult ADHD   . Allergic rhinitis   . Asthma childhood   No albuterol requirement in years, then acute flare 02/2012  . ED (erectile dysfunction) 2014  . Herpes zoster 11/2010  . Morbid obesity (HCC)   . Plantar fasciitis    [employer] orthotics helping  . Poor concentration 12/31/2011  . Thyroid nodule summer 2019   u/s -->2.1 cm-->bx neg.  Has f/u 03/2018.  . Tobacco abuse 12/28/2011    Past Surgical History:  Procedure Laterality Date  . ADENOIDECTOMY  1993  . BIOPSY THYROID  08/2017   NEG for malignancy  . WISDOM TOOTH EXTRACTION  2009    Outpatient Medications Prior to Visit  Medication Sig Dispense Refill  . Famotidine-Ca Carb-Mag Hydrox (PEPCID COMPLETE PO) Take by mouth.    . methylphenidate 27 MG PO TB24 1 tab po qAM x 15d, then increase to 2 tabs po qAM 45 tablet 0  . acetaminophen (TYLENOL) 500 MG tablet Take by mouth as needed.     No facility-administered medications prior to visit.     Allergies  Allergen  Reactions  . Cephalosporins   . Penicillins     ROS As per HPI  PE: Blood pressure (!) 146/79, pulse 85, resp. rate 16, height 5\' 10"  (1.778 m), weight 300 lb (136.1 kg), SpO2 97 %. Wt Readings from Last 2 Encounters:  01/02/18 300 lb (136.1 kg)  12/05/17 (!) 304 lb (137.9 kg)    Gen: alert, oriented x 4, affect pleasant.  Lucid thinking and conversation noted. HEENT: PERRLA, EOMI.   Neck: no LAD, mass, or thyromegaly. CV: RRR, no m/r/g LUNGS: CTA bilat, nonlabored. NEURO: no tremor or tics noted on observation.  Coordination intact. CN 2-12 grossly intact bilaterally, strength 5/5 in all extremeties.  No ataxia.   LABS:    Chemistry      Component Value Date/Time   NA 140 10/31/2012 0811   K 4.6 10/31/2012 0811   CL 102 10/31/2012 0811   CO2 30 10/31/2012 0811   BUN 11 10/31/2012 0811   CREATININE 0.9 10/31/2012 0811      Component Value Date/Time   CALCIUM 9.8 10/31/2012 0811   ALKPHOS 83 04/10/2008 0940   AST 27 04/10/2008 0940   ALT 15 04/10/2008 0940   BILITOT 1.2 04/10/2008 0940       IMPRESSION AND PLAN:  Adult ADHD: mild response to low dose methylphenidate  ER. This med is too cost prohibitive --he is self pay. Change to trial of generic adderall xr 20mg , 1 qAM, #30, no RF. Therapeutic expectations and side effect profile of medication discussed today.  Patient's questions answered.  An After Visit Summary was printed and given to the patient.  FOLLOW UP: Return in about 4 weeks (around 01/30/2018) for routine chronic illness f/u.  Signed:  Santiago BumpersPhil McGowen, MD           01/02/2018

## 2018-01-30 ENCOUNTER — Ambulatory Visit: Payer: Self-pay | Admitting: Family Medicine

## 2018-01-31 ENCOUNTER — Ambulatory Visit: Payer: Self-pay | Admitting: Family Medicine

## 2018-01-31 NOTE — Progress Notes (Deleted)
OFFICE VISIT  01/31/2018   CC: No chief complaint on file.    HPI:    Patient is a 33 y.o. Caucasian male who presents for 1 mo f/u adult ADHD. Historical data over the last couple months: mild response to low dose methylphenidate ER. This med is too cost prohibitive (he could not afford to up-titrate it in order to determine efficacy adequately) --he is self pay. Last visit 1 mo ago we changed to trial of generic adderall xr 20mg , 1 qAM, #30, no RF.  Past Medical History:  Diagnosis Date  . Adult ADHD   . Allergic rhinitis   . Asthma childhood   No albuterol requirement in years, then acute flare 02/2012  . ED (erectile dysfunction) 2014  . Herpes zoster 11/2010  . Morbid obesity (HCC)   . Plantar fasciitis    [employer] orth[employer] helping  . Poor concentration 12/31/2011  . Thyroid nodule summer 2019   u/s -->2.1 cm-->bx neg.  Has f/u 03/2018.  . Tobacco abuse 12/28/2011    Past Surgical History:  Procedure Laterality Date  . ADENOIDECTOMY  1993  . BIOPSY THYROID  08/2017   NEG for malignancy  . WISDOM TOOTH EXTRACTION  2009    Outpatient Medications Prior to Visit  Medication Sig Dispense Refill  . acetaminophen (TYLENOL) 500 MG tablet Take by mouth as needed.    Marland Kitchen amphetamine-dextroamphetamine (ADDERALL XR) 20 MG 24 hr capsule Take 1 capsule (20 mg total) by mouth every morning. 30 capsule 0  . Famotidine-Ca Carb-Mag Hydrox (PEPCID COMPLETE PO) Take by mouth.     No facility-administered medications prior to visit.     Allergies  Allergen Reactions  . Cephalosporins   . Penicillins     ROS As per HPI  PE: There were no vitals taken for this visit. ***  LABS:    Chemistry      Component Value Date/Time   NA 140 10/31/2012 0811   K 4.6 10/31/2012 0811   CL 102 10/31/2012 0811   CO2 30 10/31/2012 0811   BUN 11 10/31/2012 0811   CREATININE 0.9 10/31/2012 0811      Component Value Date/Time   CALCIUM 9.8 10/31/2012 0811   ALKPHOS 83 04/10/2008 0940    AST 27 04/10/2008 0940   ALT 15 04/10/2008 0940   BILITOT 1.2 04/10/2008 0940      IMPRESSION AND PLAN:  No problem-specific Assessment & Plan notes found for this encounter.   An After Visit Summary was printed and given to the patient.  FOLLOW UP: No follow-ups on file.  Signed:  [name], MD           01/31/2018

## 2018-02-28 ENCOUNTER — Ambulatory Visit: Payer: Self-pay | Admitting: Family Medicine

## 2018-03-11 ENCOUNTER — Telehealth: Payer: Self-pay | Admitting: Family Medicine

## 2018-03-11 NOTE — Telephone Encounter (Signed)
Copied from CRM [id number]. Topic: Quick Communication - Rx Refill/Question >> Mar 11, 2018  1:29 PM Fanny Bien wrote: Medication: amphetamine-dextroamphetamine (ADDERALL XR) 20 MG 24 hr capsule [[id number]]  pt called and stated that this is not working for him. Pt states that this is making him irritable and he is having headaches as well. Pt would like to know if original medication can be called in (ritalin). Please advise

## 2018-03-11 NOTE — Telephone Encounter (Signed)
Please advise. Thanks.  

## 2018-03-12 NOTE — Telephone Encounter (Signed)
I am ok with going back to the ritalin, but since the issue he had with this med was cost, I would suggest he call his insurer and ask what STIMULANT medication for attention deficit disorder is PREFERRED on their formulary. Tell him there may be a different formulation of ritalin that is preferred and would help but would cost him less.-thx

## 2018-03-13 MED ORDER — METHYLPHENIDATE HCL ER 27 MG PO TB24: ORAL_TABLET | ORAL | 0 refills | Status: DC

## 2018-03-13 NOTE — Telephone Encounter (Signed)
OK, ritalin rx sent in. Needs to f/u in 1 mo.-thx

## 2018-03-13 NOTE — Telephone Encounter (Signed)
SW pt, he stated that he does not have insurance. And would like Rx for Ritalin sent to [facility] [address]

## 2018-03-13 NOTE — Telephone Encounter (Signed)
Patient advised.  Appt set 03/28/2018.

## 2018-03-27 ENCOUNTER — Encounter: Payer: Self-pay | Admitting: Family Medicine

## 2018-03-27 ENCOUNTER — Ambulatory Visit: Payer: Self-pay | Admitting: Family Medicine

## 2018-03-27 VITALS — BP 131/79 | HR 80 | Temp 98.0°F | Resp 16 | Ht 70.0 in | Wt 287.0 lb

## 2018-03-27 DIAGNOSIS — F909 Attention-deficit hyperactivity disorder, unspecified type: Secondary | ICD-10-CM

## 2018-03-27 MED ORDER — METHYLPHENIDATE HCL ER 54 MG PO TB24: 1.0000 | ORAL_TABLET | Freq: Every day | ORAL | 0 refills | Status: DC

## 2018-03-27 NOTE — Progress Notes (Addendum)
OFFICE VISIT  03/27/2018   CC:  Chief Complaint  Patient presents with  . Follow-up    RCI, pt is not fasting.    HPI:    Patient is a 32 y.o. Caucasian male who presents for 2 mo f/u adult ADHD. Treatment hx: initially responded to generic concerta 27 mg but then response lacking so switched to trial of adderall last visit (cost was also an issue with methylphenidate). He called and said the adderall XR 20mg  qd was not working and caused irritability and headaches, and per his request he wanted to try a switch back to methylphenidate ER 27mg  qd (rx done 03/13/18).  Interim hx: Methylphenidate not causing HAs with any consistency and when he gets one it is very mild.   Pt states he is improved with the med at current dosing:s improved focus, concentration, task completion.  Less frustration, better multitasking, less impulsivity and restlessness.  Lasts about 10 hours average. No side effects from the medication. Mood is improved/steady on the med, as is the anxiety he chronically has.    Is working on wt loss; wt has always fluctuated A LOT. He has been working on a fasting-type diet.    Past Medical History:  Diagnosis Date  . Adult ADHD   . Allergic rhinitis   . Asthma childhood   No albuterol requirement in years, then acute flare 02/2012  . ED (erectile dysfunction) 2014  . Herpes zoster 11/2010  . Morbid obesity (HCC)   . Plantar fasciitis    [employer] orth[employer] helping  . Poor concentration 12/31/2011  . Thyroid nodule summer 2019   u/s -->2.1 cm-->bx neg.  Has f/u 03/2018.  . Tobacco abuse 12/28/2011    Past Surgical History:  Procedure Laterality Date  . ADENOIDECTOMY  1993  . BIOPSY THYROID  08/2017   NEG for malignancy  . WISDOM TOOTH EXTRACTION  2009    Outpatient Medications Prior to Visit  Medication Sig Dispense Refill  . acetaminophen (TYLENOL) 500 MG tablet Take by mouth as needed.    . Famotidine-Ca Carb-Mag Hydrox (PEPCID COMPLETE PO) Take by mouth.     . methylphenidate 27 MG PO TB24 1 tab po qAM 30 tablet 0   No facility-administered medications prior to visit.     Allergies  Allergen Reactions  . Cephalosporins   . Penicillins     ROS As per HPI  PE: Blood pressure 131/79, pulse 80, temperature 98 F (36.7 C), temperature source Oral, resp. rate 16, height 5\' 10"  (1.778 m), weight 287 lb (130.2 kg), SpO2 97 %. Wt Readings from Last 2 Encounters:  03/27/18 287 lb (130.2 kg)  01/02/18 300 lb (136.1 kg)    Gen: alert, oriented x 4, affect pleasant.  Lucid thinking and conversation noted. HEENT: PERRLA, EOMI.   Neck: no LAD, mass, or thyromegaly. CV: RRR, no m/r/g LUNGS: CTA bilat, nonlabored. NEURO: no tremor or tics noted on observation.  Coordination intact. CN 2-12 grossly intact bilaterally, strength 5/5 in all extremeties.  No ataxia.   LABS:  No results found for: TSH Lab Results  Component Value Date   WBC 6.5 04/10/2008   HGB 15.3 04/10/2008   HCT 45.3 04/10/2008   MCV 90.0 04/10/2008   PLT 167 04/10/2008   Lab Results  Component Value Date   CREATININE 0.9 10/31/2012   BUN 11 10/31/2012   NA 140 10/31/2012   K 4.6 10/31/2012   CL 102 10/31/2012   CO2 30 10/31/2012   Lab Results  Component Value Date   ALT 15 04/10/2008   AST 27 04/10/2008   ALKPHOS 83 04/10/2008   BILITOT 1.2 04/10/2008   Lab Results  Component Value Date   CHOL 167 10/31/2012   Lab Results  Component Value Date   HDL 57.40 10/31/2012   Lab Results  Component Value Date   LDLCALC 95 10/31/2012   Lab Results  Component Value Date   TRIG 72.0 10/31/2012   Lab Results  Component Value Date   CHOLHDL 3 10/31/2012     IMPRESSION AND PLAN:  Adult ADHD: decent response to methylphenidate ER 27mg  qd, with minimal HA's side effect. Increase to 54mg  qAM.  He'll finish out his current 27mg  tabs by taking 2 per morning, then fill the 54mg  tab rx that I eRx'd today. Controlled substance contract reviewed with patient  today.  Patient signed this and it will be placed in the chart.    An After Visit Summary was printed and given to the patient.  FOLLOW UP: Return in about 4 weeks (around 04/24/2018) for f/u ADHD.  Signed:  [name], MD           03/27/2018

## 2018-03-28 ENCOUNTER — Ambulatory Visit: Payer: Self-pay | Admitting: Family Medicine

## 2018-04-30 ENCOUNTER — Other Ambulatory Visit: Payer: Self-pay | Admitting: Internal Medicine

## 2018-05-05 ENCOUNTER — Other Ambulatory Visit: Payer: Self-pay | Admitting: Internal Medicine

## 2018-05-05 DIAGNOSIS — Z808 Family history of malignant neoplasm of other organs or systems: Secondary | ICD-10-CM

## 2018-05-05 DIAGNOSIS — E041 Nontoxic single thyroid nodule: Secondary | ICD-10-CM

## 2018-06-05 ENCOUNTER — Ambulatory Visit (INDEPENDENT_AMBULATORY_CARE_PROVIDER_SITE_OTHER): Payer: Self-pay | Admitting: Family Medicine

## 2018-06-05 ENCOUNTER — Encounter: Payer: Self-pay | Admitting: Family Medicine

## 2018-06-05 DIAGNOSIS — F988 Other specified behavioral and emotional disorders with onset usually occurring in childhood and adolescence: Secondary | ICD-10-CM

## 2018-06-05 DIAGNOSIS — M222X1 Patellofemoral disorders, right knee: Secondary | ICD-10-CM

## 2018-06-05 MED ORDER — METHYLPHENIDATE HCL ER 54 MG PO TB24: 1.0000 | ORAL_TABLET | Freq: Every day | ORAL | 0 refills | Status: DC

## 2018-06-05 NOTE — Progress Notes (Signed)
Virtual Visit via Video Note  I connected with pt on 06/05/18 at  2:00 PM EDT by a video enabled telemedicine application and verified that I am speaking with the correct person using two identifiers.  Location patient: home Location provider:work or home office Persons participating in the virtual visit: patient, provider  I discussed the limitations of evaluation and management by telemedicine and the availability of in person appointments. The patient expressed understanding and agreed to proceed.  Telemedicine visit is a necessity given the COVID-19 restrictions in place at the current time.  HPI: 33 y/o WM being seen today for f/u adult ADD.  I increased his methylphenidate ER to 54 mg qd at last o/v, which was 2 mo ago. Interim hx: Again, the med worked very well the first couple of days, then efficacy went down after that. Takes it almost daily, feels 30-50% improved on this dose overall.  Improved focus, concentration, task completion.  Less frustration, better multitasking, less impulsivity and restlessness. Work is going better on the med. No side effects from the med.  Mood is stable.  Has had one period of depression in his past--back around 09/2017 when he was having his thyroid nodule worked up. Drinks too much caffeine. Insomnia is bad at times.  Does have periods of excessive energy, rapid/increased talking, more impulsive. No probs with relationships or with work.    Reports onset of right peripatellar knee pain about 9 mo ago when playing a lot of pickup basketball.  No distinct knee injury, but knee started feeling quite sore.  Has never fully improved. Painful when he gets up in the morning and when going up and down stairs, worse when he runs/jogs, walks a lot.  Sitting still and not bearing wt knee is fine.  No swelling redness of knee. Has not tried a compression sleave b/c couldn't fine one to fit. The last 1 wk or so notes some R ankle pain from the altered gait due  to his knee pain.   Also, he c/o joint pains:   ROS: See pertinent positives and negatives per HPI.  Past Medical History:  Diagnosis Date  . Adult ADHD   . Allergic rhinitis   . Asthma childhood   No albuterol requirement in years, then acute flare 02/2012  . ED (erectile dysfunction) 2014  . Herpes zoster 11/2010  . Morbid obesity (HCC)   . Plantar fasciitis    [employer] orth[employer] helping  . Thyroid nodule summer 2019   u/s -->2.1 cm-->bx neg.  Has f/u 03/2018.  . Tobacco abuse 12/28/2011    Past Surgical History:  Procedure Laterality Date  . ADENOIDECTOMY  1993  . BIOPSY THYROID  08/2017   NEG for malignancy  . WISDOM TOOTH EXTRACTION  2009    Family History  Problem Relation Age of Onset  . GER disease Mother   . Cancer Father        thyroid cancer ("non-familial type"  . Depression Father      Current Outpatient Medications:  .  acetaminophen (TYLENOL) 500 MG tablet, Take by mouth as needed., Disp: , Rfl:  .  Famotidine-Ca Carb-Mag Hydrox (PEPCID COMPLETE PO), Take by mouth., Disp: , Rfl:  .  Methylphenidate HCl ER 54 MG TB24, Take 1 tablet by mouth daily with breakfast., Disp: 30 tablet, Rfl: 0  EXAM:  VITALS per patient if applicable: There were no vitals taken for this visit.   GENERAL: alert, oriented, appears well and in no acute distress  HEENT:  atraumatic, conjunttiva clear, no obvious abnormalities on inspection of external nose and ears  NECK: normal movements of the head and neck  LUNGS: on inspection no signs of respiratory distress, breathing rate appears normal, no obvious gross SOB, gasping or wheezing  CV: no obvious cyanosis  MS: moves all visible extremities without noticeable abnormality No knee erythema or swelling.  ROM intact.  PSYCH/NEURO: pleasant and cooperative, no obvious depression or anxiety, speech and thought processing grossly intact  LABS: none today    Chemistry      Component Value Date/Time   NA 140 10/31/2012  0811   K 4.6 10/31/2012 0811   CL 102 10/31/2012 0811   CO2 30 10/31/2012 0811   BUN 11 10/31/2012 0811   CREATININE 0.9 10/31/2012 0811      Component Value Date/Time   CALCIUM 9.8 10/31/2012 0811   ALKPHOS 83 04/10/2008 0940   AST 27 04/10/2008 0940   ALT 15 04/10/2008 0940   BILITOT 1.2 04/10/2008 0940     Lab Results  Component Value Date   WBC 6.5 04/10/2008   HGB 15.3 04/10/2008   HCT 45.3 04/10/2008   MCV 90.0 04/10/2008   PLT 167 04/10/2008    ASSESSMENT AND PLAN:  Discussed the following assessment and plan:  1) Adult ADD: The current medical regimen is effective;  continue present plan and medications. I did electronic rx's for methylphenidate ER 54mg , 1 qd, #30, today for this month, June 2020, and July 2020.  Appropriate fill on/after date was noted on each rx. [facility] UTD (03/27/2018). UDS not done. Of note, regarding questionable bipolar symptoms, I am not convinced that he has bipolar d/o.  Pt agrees.  2) Patellofemoral pain: discussed strengthening exercises, use of knee sleeve (rx done today), and relative rest as well as prn ibuprofen use.  I discussed the assessment and treatment plan with the patient. The patient was provided an opportunity to ask questions and all were answered. The patient agreed with the plan and demonstrated an understanding of the instructions.   The patient was advised to call back or seek an in-person evaluation if the symptoms worsen or if the condition fails to improve as anticipated.  F/u: 3 mo. At which time we'll obtain UDS.  Signed:  Santiago BumpersPhil Damarien Nyman, MD           06/05/2018

## 2018-06-12 ENCOUNTER — Telehealth: Payer: Self-pay | Admitting: Family Medicine

## 2018-06-12 ENCOUNTER — Encounter: Payer: Self-pay | Admitting: Family Medicine

## 2018-06-12 DIAGNOSIS — M222X1 Patellofemoral disorders, right knee: Secondary | ICD-10-CM | POA: Insufficient documentation

## 2018-06-12 NOTE — Telephone Encounter (Signed)
Pls do EMR rx, print, and then fax.  Right knee sleeve  Dx is M22.2X1 (patellofemoral pain syndrome, right).-thx

## 2018-06-12 NOTE — Telephone Encounter (Signed)
Patient cannot fit [facility]. This was addressed in his last OV. Can patient get an Rx emailed to him to take to [facility], [place]? His email address is [email or web address]. Thanks

## 2018-06-12 NOTE — Telephone Encounter (Signed)
We are unable to email but we can print, fax, or mail to patient.   Dr Milinda Cave is this okay to write RX and we can fax or mail?  Thanks

## 2018-06-13 ENCOUNTER — Telehealth: Payer: Self-pay

## 2018-06-13 ENCOUNTER — Other Ambulatory Visit: Payer: Self-pay

## 2018-06-13 DIAGNOSIS — M222X1 Patellofemoral disorders, right knee: Secondary | ICD-10-CM

## 2018-06-13 MED ORDER — KNEE COMPRESSION SLEEVE/L/XL MISC: 1.0000 | Freq: Once | 0 refills | Status: AC

## 2018-06-13 NOTE — Telephone Encounter (Signed)
Pt was notified by phone call regarding Rx for knee sleeve.  Copied from CRM [id number]. Topic: General - Inquiry >> Jun 13, 2018  2:49 PM Randol Kern wrote: Pt called back requesting script for knee sleeve be sent to [facility], [address] [place], [place] [address]. Pt needs to be fitted, [facility]. Please advise Best Contact: [phone] Open until 6 today Open on Saturday until 4

## 2018-06-17 ENCOUNTER — Other Ambulatory Visit: Payer: Self-pay

## 2018-06-17 NOTE — Telephone Encounter (Signed)
MyChart message read.

## 2018-09-17 ENCOUNTER — Other Ambulatory Visit: Payer: Self-pay

## 2018-11-17 ENCOUNTER — Inpatient Hospital Stay: Admission: RE | Admit: 2018-11-17 | Payer: Self-pay | Source: Ambulatory Visit

## 2018-11-25 ENCOUNTER — Ambulatory Visit
Admission: RE | Admit: 2018-11-25 | Discharge: 2018-11-25 | Disposition: A | Discharge status: Home / Self Care | Payer: No Typology Code available for payment source | Source: Ambulatory Visit | Attending: Internal Medicine | Admitting: Internal Medicine

## 2018-11-25 DIAGNOSIS — E041 Nontoxic single thyroid nodule: Secondary | ICD-10-CM

## 2018-11-25 DIAGNOSIS — Z808 Family history of malignant neoplasm of other organs or systems: Secondary | ICD-10-CM

## 2018-12-04 ENCOUNTER — Other Ambulatory Visit: Payer: Self-pay

## 2018-12-04 ENCOUNTER — Encounter: Payer: Self-pay | Admitting: Family Medicine

## 2018-12-04 ENCOUNTER — Ambulatory Visit: Payer: Self-pay | Admitting: Family Medicine

## 2018-12-04 VITALS — BP 115/72 | HR 91 | Temp 98.0°F | Resp 16 | Ht 70.0 in | Wt 313.6 lb

## 2018-12-04 DIAGNOSIS — Z23 Encounter for immunization: Secondary | ICD-10-CM

## 2018-12-04 DIAGNOSIS — M25572 Pain in left ankle and joints of left foot: Secondary | ICD-10-CM

## 2018-12-04 DIAGNOSIS — F909 Attention-deficit hyperactivity disorder, unspecified type: Secondary | ICD-10-CM

## 2018-12-04 DIAGNOSIS — M722 Plantar fascial fibromatosis: Secondary | ICD-10-CM

## 2018-12-04 DIAGNOSIS — M222X1 Patellofemoral disorders, right knee: Secondary | ICD-10-CM

## 2018-12-04 DIAGNOSIS — G8929 Other chronic pain: Secondary | ICD-10-CM

## 2018-12-04 MED ORDER — METHYLPHENIDATE HCL ER 54 MG PO TB24: 1.0000 | ORAL_TABLET | Freq: Every day | ORAL | 0 refills | Status: DC

## 2018-12-04 NOTE — Patient Instructions (Signed)
Patellofemoral Pain Syndrome  Patellofemoral pain syndrome is a condition in which the tissue (cartilage) on the underside of the kneecap (patella) softens or breaks down. This causes pain in the front of the knee. The condition is also called runner's knee or chondromalacia patella. Patellofemoral pain syndrome is most common in young adults who are active in sports. The knee is the largest joint in the body. The patella covers the front of the knee and is attached to muscles above and below the knee. The underside of the patella is covered with a smooth type of cartilage (synovium). The smooth surface helps the patella to glide easily when you move your knee. Patellofemoral pain syndrome causes swelling in the joint linings and bone surfaces in the knee. What are the causes? This condition may be caused by:  Overuse of the knee.  Poor alignment of your knee joints.  Weak leg muscles.  A direct blow to your kneecap. What increases the risk? You are more likely to develop this condition if:  You do a lot of activities that can wear down your kneecap. These include: ? Running. ? Squatting. ? Climbing stairs.  You start a new physical activity or exercise program.  You wear shoes that do not fit well.  You do not have good leg strength.  You are overweight. What are the signs or symptoms? The main symptom of this condition is knee pain. This may feel like a dull, aching pain underneath your patella, in the front of your knee. There may be a popping or cracking sound when you move your knee. Pain may get worse with:  Exercise.  Climbing stairs.  Running.  Jumping.  Squatting.  Kneeling.  Sitting for a long time.  Moving or pushing on your patella. How is this diagnosed? This condition may be diagnosed based on:  Your symptoms and medical history. You may be asked about your recent physical activities and which ones cause knee pain.  A physical exam. This may include:  ? Moving your patella back and forth. ? Checking your range of knee motion. ? Having you squat or jump to see if you have pain. ? Checking the strength of your leg muscles.  Imaging tests to confirm the diagnosis. These may include an MRI of your knee. How is this treated? This condition may be treated at home with rest, ice, compression, and elevation (RICE).  Other treatments may include:  Nonsteroidal anti-inflammatory drugs (NSAIDs).  Physical therapy to stretch and strengthen your leg muscles.  Shoe inserts (orthotics) to take stress off your knee.  A knee brace or knee support.  Adhesive tapes to the skin.  Surgery to remove damaged cartilage or move the patella to a better position. This is rare. Follow these instructions at home: If you have a shoe or brace:  Wear the shoe or brace as told by your health care provider. Remove it only as told by your health care provider.  Loosen the shoe or brace if your toes tingle, become numb, or turn cold and blue.  Keep the shoe or brace clean.  If the shoe or brace is not waterproof: ? Do not let it get wet. ? Cover it with a watertight covering when you take a bath or a shower. Managing pain, stiffness, and swelling  If directed, put ice on the painful area. ? If you have a removable shoe or brace, remove it as told by your health care provider. ? Put ice in a plastic bag. ?   Place a towel between your skin and the bag. ? Leave the ice on for 20 minutes, 2-3 times a day.  Move your toes often to avoid stiffness and to lessen swelling.  Rest your knee: ? Avoid activities that cause knee pain. ? When sitting or lying down, raise (elevate) the injured area above the level of your heart, whenever possible. General instructions  Take over-the-counter and prescription medicines only as told by your health care provider.  Use splints, braces, knee supports, or walking aids as directed by your health care provider.  Perform  stretching and strengthening exercises as told by your health care provider or physical therapist.  Do not use any products that contain nicotine or tobacco, such as cigarettes and e-cigarettes. These can delay healing. If you need help quitting, ask your health care provider.  Return to your normal activities as told by your health care provider. Ask your health care provider what activities are safe for you.  Keep all follow-up visits as told by your health care provider. This is important. Contact a health care provider if:  Your symptoms get worse.  You are not improving with home care. Summary  Patellofemoral pain syndrome is a condition in which the tissue (cartilage) on the underside of the kneecap (patella) softens or breaks down.  This condition causes swelling in the joint linings and bone surfaces in the knee. This leads to pain in the front of the knee.  This condition may be treated at home with rest, ice, compression, and elevation (RICE).  Use splints, braces, knee supports, or walking aids as directed by your health care provider. This information is not intended to replace advice given to you by your health care provider. Make sure you discuss any questions you have with your health care provider. Document Released: 12/27/2008 Document Revised: 02/18/2017 Document Reviewed: 02/18/2017 Elsevier Patient Education  2020 [employer]  

## 2018-12-04 NOTE — Progress Notes (Signed)
OFFICE VISIT  12/04/2018   CC:  Chief Complaint  Patient presents with  . Follow-up    adult ADD   HPI:    Patient is a 33 y.o. Caucasian male who presents for 6 mo f/u adult ADD. A/P as of last f/u visit: "Adult ADD: The current medical regimen is effective;  continue present plan and medications. I did electronic rx's for methylphenidate ER 54mg , 1 qd, #30, today for this month, June 2020, and July 2020.  Appropriate fill on/after date was noted on each rx. CSC UTD (03/27/2018). UDS not done. Of note, regarding questionable bipolar symptoms, I am not convinced that he has bipolar d/o.  Pt agrees."  Interim hx: Work->[employer] prior to pandemic.  Has been on leave of absence to take care of his ill father.  Pt states all is going well with the med at current dosing: much improved focus, concentration, task completion.  Less frustration, better multitasking, less impulsivity and restlessness.  Mood is stable. No side effects from the medication. The med also helps with tendency to get anxious or depressed mood. PMP AWARE reviewed today: most recent methylphenidate rx filled 11/11/18, #30, rx by me.  No red flags. CSC dated 03/27/18.  Right knee pain still, has been >1 yr now, around knee cap. Feels fine other than running. L ankle lateral aspect also hurts some after running. Hx of plantar fasciitis, has used shoe inserts successfully.  Now the last 2 mo has had plantar pain worse than usual.  Pain on entire plantar surface.  Worse upon getting out of bed in morning and with walking after prolonged period of sitting.  Past Medical History:  Diagnosis Date  . Adult ADHD   . Allergic rhinitis   . Asthma childhood   No albuterol requirement in years, then acute flare 02/2012  . ED (erectile dysfunction) 2014  . Herpes zoster 11/2010  . Morbid obesity ([place])   . Plantar fasciitis    [employer] orthotics helping  . Thyroid nodule summer 2019   u/s -->2.1 cm-->bx neg.  Has f/u  03/2018.  . Tobacco abuse 12/28/2011   Quit smoking 2019, then took up dipping a lot.    Past Surgical History:  Procedure Laterality Date  . ADENOIDECTOMY  1993  . BIOPSY THYROID  08/2017   NEG for malignancy  . [place] EXTRACTION  2009    Outpatient Medications Prior to Visit  Medication Sig Dispense Refill  . acetaminophen (TYLENOL) 500 MG tablet Take by mouth as needed.    . Famotidine-Ca Carb-Mag Hydrox (PEPCID COMPLETE PO) Take by mouth.    . Omeprazole Magnesium (PRILOSEC OTC PO) Take by mouth daily.    . Methylphenidate HCl ER 54 MG TB24 Take 1 tablet by mouth daily with breakfast. 30 tablet 0   No facility-administered medications prior to visit.     Allergies  Allergen Reactions  . Cephalosporins   . Penicillins     ROS As per HPI  PE: Blood pressure 115/72, pulse 91, temperature 98 F (36.7 C), temperature source Temporal, resp. rate 16, height 5\' 10"  (1.778 m), weight (!) 313 lb 9.6 oz (142.2 kg), SpO2 95 %. Body mass index is 45 kg/m.  Wt Readings from Last 2 Encounters:  12/04/18 (!) 313 lb 9.6 oz (142.2 kg)  03/27/18 287 lb (130.2 kg)    Gen: alert, oriented x 4, affect pleasant.  Lucid thinking and conversation noted. HEENT: PERRLA, EOMI.   Neck: no LAD, mass, or thyromegaly. CV:  RRR, no m/r/g LUNGS: CTA bilat, nonlabored. NEURO: no tremor or tics noted on observation.  Coordination intact. CN 2-12 grossly intact bilaterally, strength 5/5 in all extremeties.  No ataxia.   LABS:    Chemistry      Component Value Date/Time   NA 140 10/31/2012 0811   K 4.6 10/31/2012 0811   CL 102 10/31/2012 0811   CO2 30 10/31/2012 0811   BUN 11 10/31/2012 0811   CREATININE 0.9 10/31/2012 0811      Component Value Date/Time   CALCIUM 9.8 10/31/2012 0811   ALKPHOS 83 04/10/2008 0940   AST 27 04/10/2008 0940   ALT 15 04/10/2008 0940   BILITOT 1.2 04/10/2008 0940       IMPRESSION AND PLAN:  1) Adult ADD: The current medical regimen is effective;   continue present plan and medications. I did electronic rx's for generic concerta 54mg , 1 qd, #30 for this month, Dec 2020, and Jan 2021.  Appropriate fill on/after date was noted on each rx.  2) R knee patellofemoral pain, L ankle sprain hx with some intermittent pain, and L plantar fasciitis. Discussed general conservative mgmt techniques for these conditions, including home rehab, ice prn, and relative rest prn.  He understands that wt loss could significantly alleviate these problems.  Spent 30 min with pt today, with >50% of this time spent in counseling and care coordination regarding the above problems.  An After Visit Summary was printed and given to the patient.  FOLLOW UP: Return in about 6 months (around 06/03/2019) for routine chronic illness f/u.  Signed:  08/03/2019, MD           12/04/2018

## 2019-01-12 ENCOUNTER — Telehealth: Payer: Self-pay | Admitting: Family Medicine

## 2019-01-12 NOTE — Telephone Encounter (Signed)
Patient's last visit was 12/04/18 and advised to f/u 6 months. Electronic Rx's sent for generic concerta 54mg  1 daily for Dec and Jan 2021. He was taking adderall XR 20mg  but this med was discontinued on 03/13/18 due to side effects.  Please advise, thanks.

## 2019-01-12 NOTE — Telephone Encounter (Signed)
Patient reports that he gets terrible headaches when he takes  Methylphenidate HCl ER 54 MG TB24 [[id number]  He has stopped taking the above med 2 weeks ago, and hasn't had a headache since then. He has taken adderrall before and it gave him a headache. Patient asking if there is a smaller dose of med OR maybe try something else.  Please advise. Because of time of call, patient is aware and is okay with not receiving a return call until tomorrow 01/13/19.  Patient can be reached at [phone].

## 2019-01-13 NOTE — Telephone Encounter (Signed)
Patient was notified to schedule appt. Offered to do this while on the phone but pt declined and will call back. He was advised to try and schedule within the next week.

## 2019-01-13 NOTE — Telephone Encounter (Signed)
Needs telemed visit at his convenience to discuss.  -thx

## 2019-06-04 ENCOUNTER — Ambulatory Visit: Payer: Self-pay | Attending: Internal Medicine

## 2019-06-04 DIAGNOSIS — Z23 Encounter for immunization: Secondary | ICD-10-CM

## 2019-06-04 NOTE — Progress Notes (Signed)
   Covid-19 Vaccination Clinic  Name:  Cesar Young    MRN: [id number] DOB: 1985/06/11  06/04/2019  [name] was observed post Covid-19 immunization for 15 minutes without incident. He was provided with Vaccine Information Sheet and instruction to access the V-Safe system.   [name] was instructed to call 911 with any severe reactions post vaccine: Marland Kitchen Difficulty breathing  . Swelling of face and throat  . A fast heartbeat  . A bad rash all over body  . Dizziness and weakness   Immunizations Administered    Name Date Dose VIS Date Route   Pfizer COVID-19 Vaccine 06/04/2019  3:07 PM 0.3 mL 03/18/2018 Intramuscular   Manufacturer: [employer], [employer]   Lot: [id number]   NDC: [address]-4

## 2019-06-29 ENCOUNTER — Ambulatory Visit: Payer: Self-pay

## 2019-07-02 ENCOUNTER — Ambulatory Visit: Payer: Self-pay | Attending: Internal Medicine

## 2019-07-02 DIAGNOSIS — Z23 Encounter for immunization: Secondary | ICD-10-CM

## 2019-07-02 NOTE — Progress Notes (Signed)
   Covid-19 Vaccination Clinic  Name:  Cesar Young    MRN: [id number] DOB: 07-08-1985  07/02/2019  [name] was observed post Covid-19 immunization for 15 minutes without incident. He was provided with Vaccine Information Sheet and instruction to access the V-Safe system.   [name] was instructed to call 911 with any severe reactions post vaccine: Marland Kitchen Difficulty breathing  . Swelling of face and throat  . A fast heartbeat  . A bad rash all over body  . Dizziness and weakness   Immunizations Administered    Name Date Dose VIS Date Route   Pfizer COVID-19 Vaccine 07/02/2019  2:49 PM 0.3 mL 03/18/2018 Intramuscular   Manufacturer: [employer], [employer]   Lot: [id number]   NDC: [address]-7

## 2019-08-10 ENCOUNTER — Telehealth: Payer: Self-pay

## 2019-08-10 NOTE — Telephone Encounter (Signed)
No. Dose only once per 24.

## 2019-08-10 NOTE — Telephone Encounter (Signed)
Spoke with patient regarding medication question.  Patient reports interrupted sleep patterns due to caring for elderly parents.  Would like to know if he can repeat dose sooner than 24 hours? Also, encouraged to make an appointment, patient will call back to schedule.    [facility] Day - Client TELEPHONE ADVICE RECORD AccessNurse Patient Name: Cesar Young Gender: Male DOB: [birth year] Age: 34 Y 9 M 12 D Return Phone Number: [phone] (Primary) Address: City/State/Zip: [place] [address] Client [facility] Day - Client Client Site [facility] - Day Physician [name] - MD Contact Type Call Who Is Calling Patient / Member / Family / Caregiver Call Type Triage / Clinical Relationship To Patient Self Return Phone Number [phone] (Primary) Chief Complaint Health information question (non symptomatic) Reason for Call Symptomatic / Request for Health Information Initial Comment Caller has question about Methelphandate dosing. Please call him. He takes it for ADHD. Translation No Nurse Assessment Nurse: Loistine Simas, RN, [name] Date/Time (Eastern Time): 08/08/2019 8:04:55 AM Confirm and document reason for call. If symptomatic, describe symptoms. ---Caller has question about Methelphandate dosing. Please call him. He takes it for ADHD. States he has an erratic sleep schedule d/t caring for elderly parent. Curious if he can take a 2nd dose after 12 hours. Has the patient had close contact with a person known or suspected to have the novel coronavirus illness OR traveled / lives in area with major community spread (including international travel) in the last 14 days from the onset of symptoms? * If Asymptomatic, screen for exposure and travel within the last 14 days. ---No Does the patient have any new or worsening symptoms? ---No Please document clinical information provided and list any resource  used. ---Medication question that can wait until office is open on Monday. Caller stated he tried to call pharmacy, but they said it was a question for his PCP. Disp. Time Lamount Cohen Time) Disposition Final User 08/08/2019 8:11:59 AM Clinical Call Yes Loistine Simas, RN, Roe Rutherford Disagree/Comply Comply Caller Understands Yes PreDisposition Did not know what to do

## 2019-08-10 NOTE — Telephone Encounter (Signed)
Advised patient medication is to be taken every 24 hours only. Patient verbalized understanding, declines appt at this time.

## 2019-08-26 ENCOUNTER — Other Ambulatory Visit: Payer: Self-pay

## 2019-08-26 ENCOUNTER — Telehealth (INDEPENDENT_AMBULATORY_CARE_PROVIDER_SITE_OTHER): Payer: 59 | Admitting: Family Medicine

## 2019-08-26 ENCOUNTER — Encounter: Payer: Self-pay | Admitting: Family Medicine

## 2019-08-26 VITALS — Wt 325.0 lb

## 2019-08-26 DIAGNOSIS — T50905A Adverse effect of unspecified drugs, medicaments and biological substances, initial encounter: Secondary | ICD-10-CM

## 2019-08-26 DIAGNOSIS — L739 Follicular disorder, unspecified: Secondary | ICD-10-CM

## 2019-08-26 DIAGNOSIS — F988 Other specified behavioral and emotional disorders with onset usually occurring in childhood and adolescence: Secondary | ICD-10-CM

## 2019-08-26 DIAGNOSIS — N5201 Erectile dysfunction due to arterial insufficiency: Secondary | ICD-10-CM | POA: Diagnosis not present

## 2019-08-26 MED ORDER — MUPIROCIN 2 % EX OINT
1.0000 | TOPICAL_OINTMENT | Freq: Three times a day (TID) | CUTANEOUS | 1 refills | Status: DC
Start: 2019-08-26 — End: 2020-01-21

## 2019-08-26 NOTE — Progress Notes (Signed)
Virtual Visit via Video Note  I connected with pt on 08/26/19 at  4:00 PM EDT by a video enabled telemedicine application and verified that I am speaking with the correct person using two identifiers.  Location patient: home Location provider:work or home office Persons participating in the virtual visit: patient, provider  I discussed the limitations of evaluation and management by telemedicine and the availability of in person appointments. The patient expressed understanding and agreed to proceed.   HPI: 34 y/o WM being seen today for f/u adult ADD. A/P as of last visit: "1) Adult ADD: The current medical regimen is effective;  continue present plan and medications. I did electronic rx's for generic concerta 54mg , 1 qd, #30 for this month, Dec 2020, and Jan 2021.  Appropriate fill on/after date was noted on each rx.  2) R knee patellofemoral pain, L ankle sprain hx with some intermittent pain, and L plantar fasciitis. Discussed general conservative mgmt techniques for these conditions, including home rehab, ice prn, and relative rest prn.  He understands that wt loss could significantly alleviate these problems."  INTERIM HX: He stopped his ADD med for a couple months b/c he was having HAs (nagging/diffuse/tension--no nausea or photophobia) on it.  Most of the time it was while the med was wearing off.  HAs went away when he stopped taking it. 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 HAs returned again after a few days back on the med so he restopped it and HAs have resolved. He has also been drinking lots of caffeinated drinks while on the methylphenidate.  Reports long hx of small "acne" papular spots on abdomen, scratches it and little white substance is there.  At base of hair follicle.  No pain or itching.  Hx of difficulty maintaining erections.  Hx of testost check and it was normal. He asks if wt loss would potentially help  this problem. Libido is fine, ability to get erections is fine.  ROS: no fevers, no CP, no SOB, no wheezing, no cough, no dizziness, no rashes, no melena/hematochezia.  No polyuria or polydipsia.  No myalgias or arthralgias.  No focal weakness, paresthesias, or tremors.  No acute vision or hearing abnormalities. No n/v/d or abd pain.  No palpitations.     ROS: See pertinent positives and negatives per HPI.  Past Medical History:  Diagnosis Date  . Adult ADHD   . Allergic rhinitis   . Asthma childhood   No albuterol requirement in years, then acute flare 02/2012  . ED (erectile dysfunction) 2014  . Herpes zoster 11/2010  . Morbid obesity ([facility])   . Plantar fasciitis    [employer] orthotics helping  . Thyroid nodule summer 2019   u/s -->2.1 cm-->bx neg.  Has f/u 03/2018.  . Tobacco abuse 12/28/2011   Quit smoking 2019, then took up dipping a lot.    Past Surgical History:  Procedure Laterality Date  . ADENOIDECTOMY  1993  . BIOPSY THYROID  08/2017   NEG for malignancy  . WISDOM TOOTH EXTRACTION  2009    Family History  Problem Relation Age of Onset  . GER disease Mother   . Cancer Father        thyroid cancer ("non-familial type"  . Depression Father      Current Outpatient Medications:  .  acetaminophen (TYLENOL) 500 MG tablet, Take by mouth as needed., Disp: , Rfl:  .  Famotidine-Ca Carb-Mag Hydrox (PEPCID  COMPLETE PO), Take by mouth., Disp: , Rfl:  .  Methylphenidate HCl ER 54 MG TB24, Take 1 tablet by mouth daily with breakfast., Disp: 30 tablet, Rfl: 0 .  Omeprazole Magnesium (PRILOSEC OTC PO), Take by mouth daily., Disp: , Rfl:   EXAM:  VITALS per patient if applicable:  Vitals with BMI 08/26/2019 12/04/2018 03/27/2018  Height - 5\' 10"  5\' 10"   Weight 325 lbs 313 lbs 10 oz 287 lbs  BMI - 45 41.18  Systolic - 115 131  Diastolic - 72 79  Pulse - 91 80     GENERAL: alert, oriented, appears well and in no acute distress  HEENT: atraumatic, conjunttiva clear, no  obvious abnormalities on inspection of external nose and ears  NECK: normal movements of the head and neck  LUNGS: on inspection no signs of respiratory distress, breathing rate appears normal, no obvious gross SOB, gasping or wheezing  CV: no obvious cyanosis  MS: moves all visible extremities without noticeable abnormality  PSYCH/NEURO: pleasant and cooperative, no obvious depression or anxiety, speech and thought processing grossly intact  LABS; none today    Chemistry      Component Value Date/Time   NA 140 10/31/2012 0811   K 4.6 10/31/2012 0811   CL 102 10/31/2012 0811   CO2 30 10/31/2012 0811   BUN 11 10/31/2012 0811   CREATININE 0.9 10/31/2012 0811      Component Value Date/Time   CALCIUM 9.8 10/31/2012 0811   ALKPHOS 83 04/10/2008 0940   AST 27 04/10/2008 0940   ALT 15 04/10/2008 0940   BILITOT 1.2 04/10/2008 0940     ASSESSMENT AND PLAN:  Discussed the following assessment and plan:  1) Adult ADD: responds well to methylphenidate ER 54mg  dose but seems to have HAs on it--mild/nagging, but persistent.  He also drinks LOTS of caffeine when on the med, though. He will try gradually cutting back on caffeine while getting back on the 54mg  methylph dose and see how it goes with the HAs.  If HAs remain a problem then we'll cut back to 36 mg dose.  He'll call to report or send [facility] message, otherwise plan on continuing 54mg  qd dosing and he'll request next RF when he runs out of current supply.  2) Med side effect: see #1 above.  3) Folliculitis, on abdomen. Will do trial of bactroban ointment (rx)  to each spot since they are only few/sparse.  4) ED, suspect related to obesity/lack of exercise, poor diet. Encouraged him to proceed with his plan of getting serious about therapeutic lifestyle changes.  -we discussed possible serious and likely etiologies, options for evaluation and workup, limitations of telemedicine visit vs in person visit, treatment, treatment  risks and precautions. Pt prefers to treat via telemedicine empirically rather then risking or undertaking an in person visit at this moment. Patient agrees to seek prompt in person care if worsening, new symptoms arise, or if is not improving with treatment.   I discussed the assessment and treatment plan with the patient. The patient was provided an opportunity to ask questions and all were answered. The patient agreed with the plan and demonstrated an understanding of the instructions.   The patient was advised to call back or seek an in-person evaluation if the symptoms worsen or if the condition fails to improve as anticipated.  F/u: 3 mo  Signed:  04/12/2008, MD           08/26/2019

## 2019-09-17 ENCOUNTER — Other Ambulatory Visit: Payer: Self-pay

## 2019-09-17 MED ORDER — METHYLPHENIDATE HCL ER 54 MG PO TB24: 1.0000 | ORAL_TABLET | Freq: Every day | ORAL | 0 refills | Status: DC

## 2019-09-17 NOTE — Telephone Encounter (Signed)
Patient refill request  Methylphenidate HCl ER 54 MG TB24 [[id number]]    [facility] [address]

## 2019-09-17 NOTE — Telephone Encounter (Signed)
Requesting: methylphenidate Contract:03/27/18 UDS:n/a Last Visit:08/26/19 Next Visit: advised to f/u 3 months Last Refill: 12/04/18 (30,0)  Please Advise. Medication pending

## 2019-09-18 NOTE — Telephone Encounter (Signed)
Patient made aware refill sent.  

## 2019-09-23 ENCOUNTER — Other Ambulatory Visit (HOSPITAL_COMMUNITY): Payer: Self-pay | Admitting: Internal Medicine

## 2019-10-29 ENCOUNTER — Telehealth: Payer: Self-pay

## 2019-10-29 MED ORDER — METHYLPHENIDATE HCL ER 54 MG PO TB24: 1.0000 | ORAL_TABLET | Freq: Every day | ORAL | 0 refills | Status: DC

## 2019-10-29 NOTE — Telephone Encounter (Signed)
Requesting: mthylphenidate hcier mg tb24  Contract: 03/27/18 UDS:n/a Last Visit:0804/21 Next Visit:n/a Last Refill:0826/21 (30,0)  Please Advise

## 2019-10-29 NOTE — Telephone Encounter (Signed)
OK. I sent in methylphenidate ER 54 mg for this month and for November

## 2019-10-29 NOTE — Telephone Encounter (Signed)
Patient refill request   Methylphenidate HCl ER 54 MG TB24 [[id number]   [id number] [place], [place] - [address]

## 2019-10-29 NOTE — Addendum Note (Signed)
Addended by: Jeoffrey Massed on: 10/29/2019 05:36 PM   Modules accepted: Orders

## 2019-10-30 NOTE — Telephone Encounter (Signed)
Left detailed message advising refill sent. Okay per dpr 

## 2019-11-17 MED FILL — FLUARIX QUADRIVALENT 0.5 ML: 0.5 | 1 days supply | Qty: 1 | Fill #0

## 2019-11-25 ENCOUNTER — Telehealth: Payer: Self-pay

## 2019-11-25 NOTE — Telephone Encounter (Signed)
Patient refill request  Methylphenidate HCl ER 54 MG TB24 [[id number]]   [id number] [place], [place] - [address]

## 2019-11-25 NOTE — Telephone Encounter (Signed)
Pharmacy stated that pt will be able to fill medication on 11/28/19. Pt is scheduled for in office f/u

## 2019-11-25 NOTE — Telephone Encounter (Signed)
Spoke with patient and he contacted pharmacy already stating he had no refills on file. Tried calling pharmacy twice and transferred to prescriber voicemail. Opted NOT to leave voicemail, will try again in a little bit.

## 2019-12-11 ENCOUNTER — Other Ambulatory Visit: Payer: Self-pay

## 2019-12-11 ENCOUNTER — Encounter: Payer: Self-pay | Admitting: Family Medicine

## 2019-12-11 ENCOUNTER — Ambulatory Visit: Payer: Self-pay | Admitting: Family Medicine

## 2019-12-11 VITALS — BP 114/73 | HR 88 | Temp 97.9°F | Resp 16 | Ht 70.0 in | Wt 317.2 lb

## 2019-12-11 DIAGNOSIS — N529 Male erectile dysfunction, unspecified: Secondary | ICD-10-CM

## 2019-12-11 DIAGNOSIS — F411 Generalized anxiety disorder: Secondary | ICD-10-CM

## 2019-12-11 DIAGNOSIS — F988 Other specified behavioral and emotional disorders with onset usually occurring in childhood and adolescence: Secondary | ICD-10-CM

## 2019-12-11 MED ORDER — METHYLPHENIDATE HCL ER 54 MG PO TB24: 1.0000 | ORAL_TABLET | Freq: Every day | ORAL | 0 refills | Status: DC

## 2019-12-11 MED ORDER — CITALOPRAM HYDROBROMIDE 20 MG PO TABS: 20.0000 mg | ORAL_TABLET | Freq: Every day | ORAL | 1 refills | Status: DC

## 2019-12-11 NOTE — Progress Notes (Signed)
OFFICE VISIT  12/11/2019  CC:  Chief Complaint  Patient presents with  . Follow-up    ADD    HPI:    Patient is a 34 y.o. Caucasian male who presents for 3 mo f/u adult ADD. A/P as of last visit: "1) Adult ADD: responds well to methylphenidate ER 54mg  dose but seems to have HAs on it--mild/nagging, but persistent.  He also drinks LOTS of caffeine when on the med, though. He will try gradually cutting back on caffeine while getting back on the 54mg  methylph dose and see how it goes with the HAs.  If HAs remain a problem then we'll cut back to 36 mg dose.  He'll call to report or send [facility] message, otherwise plan on continuing 54mg  qd dosing and he'll request next RF when he runs out of current supply.  2) Med side effect: see #1 above.  3) Folliculitis, on abdomen. Will do trial of bactroban ointment (rx)  to each spot since they are only few/sparse.  4) ED, suspect related to obesity/lack of exercise, poor diet. Encouraged him to proceed with his plan of getting serious about therapeutic lifestyle changes."  INTERIM HX: No longer having HAs. Stopped drinking energy drinks and this seemed to be the problem. Lots of stress taking care of ill father, not getting good sleep. Eating much healthier last couple weeks at least.  Cycling, walking, lifting wts---last few weeks.  All this has helped him feel a lot better physically and mentally.  Pt states all is going well with the med at current dosing: much improved focus, concentration, task completion.  Less frustration, better multitasking, less impulsivity and restlessness.  Mood is stable. No side effects from the medication.  He admits to chronic anxiety, excessive worry, periods of feeling overwhelmed with things in life. Mood down sometimes but nothing real deep or persistent.  Worries to affect his sleep. No alc/drug use.  He does dip tobacco, has recently switched to vaping to try to start ween down off nicotine.   Still  some probs maintaining erections.  Some dec in ability to get erections, is dating a girl now and he asks if viagra is an option for him.   PMP AWARE reviewed today: most recent rx for methylphenidate ER 54mg  was filled 11/29/19, # 30, rx by me. No red flags.  Past Medical History:  Diagnosis Date  . Adult ADHD   . Allergic rhinitis   . Asthma childhood   No albuterol requirement in years, then acute flare 02/2012  . ED (erectile dysfunction) 2014  . Herpes zoster 11/2010  . Morbid obesity ([facility])   . Plantar fasciitis    [employer] orthotics helping  . Thyroid nodule summer 2019   u/s -->2.1 cm-->bx neg.  Has f/u 03/2018.  . Tobacco abuse 12/28/2011   Quit smoking 2019, then took up dipping a lot.    Past Surgical History:  Procedure Laterality Date  . ADENOIDECTOMY  1993  . BIOPSY THYROID  08/2017   NEG for malignancy  . WISDOM TOOTH EXTRACTION  2009    Outpatient Medications Prior to Visit  Medication Sig Dispense Refill  . acetaminophen (TYLENOL) 500 MG tablet Take by mouth as needed.    . Famotidine-Ca Carb-Mag Hydrox (PEPCID COMPLETE PO) Take by mouth.    . Omeprazole Magnesium (PRILOSEC OTC PO) Take by mouth daily.    . Methylphenidate HCl ER 54 MG TB24 Take 1 tablet by mouth daily with breakfast. 30 tablet 0  . mupirocin  ointment (BACTROBAN) 2 % Apply 1 application topically 3 (three) times daily. (Patient not taking: Reported on 12/11/2019) 15 g 1   No facility-administered medications prior to visit.    Allergies  Allergen Reactions  . Cephalosporins   . Penicillins     ROS As per HPI  PE: Vitals with BMI 12/11/2019 08/26/2019 12/04/2018  Height 5\' 10"  - 5\' 10"   Weight 317 lbs 3 oz 325 lbs 313 lbs 10 oz  BMI 45.51 - 45  Systolic 114 - 115  Diastolic 73 - 72  Pulse 88 - 91     Gen: Alert, well appearing.  Patient is oriented to person, place, time, and situation. AFFECT: pleasant, lucid thought and speech. No further exam today.  LABS:   none  IMPRESSION AND PLAN:  1) Adult ADHD: great response to/benefit from methylphenidate ER 54mg , 1 qd. No side effects. I sent in rx for this med to take 1 qd, #30 for Dec 2021 and for Jan and Feb 2022. Approp fill on/after dates on rx's. CSC updated.  2) GAD: start citalopram 20mg  qd. Therapeutic expectations and side effect profile of medication discussed today.  Patient's questions answered.  3) ED: will wait to see if this improves as his anxiety improves. No ED med for now.  An After Visit Summary was printed and given to the patient.  FOLLOW UP: Return in about 4 weeks (around 01/08/2020) for f/u anxiety.  Signed:  Feb, MD           12/11/2019

## 2019-12-22 ENCOUNTER — Encounter: Payer: Self-pay | Admitting: Family Medicine

## 2019-12-22 MED ORDER — BUPROPION HCL ER (XL) 150 MG PO TB24: 150.0000 mg | ORAL_TABLET | Freq: Every day | ORAL | 0 refills | Status: DC

## 2019-12-22 NOTE — Telephone Encounter (Signed)
I'm ok with a switch to wellbutrin BUT I cannot stress enough the fact that antidepressants should be take every day (not just on days he feels bad) AND it takes a minimum of 3 weeks on the med to start seeing improvement in depression and/or anxiety. I'll eRx wellbutrin xl 150mg  qd now.

## 2019-12-22 NOTE — Telephone Encounter (Signed)
Please advise of message below. Patient was last seen 11/17 and started on citalopram 20mg .

## 2020-01-08 ENCOUNTER — Ambulatory Visit: Payer: Self-pay | Attending: Internal Medicine

## 2020-01-08 ENCOUNTER — Ambulatory Visit: Payer: Self-pay | Admitting: Family Medicine

## 2020-01-08 ENCOUNTER — Other Ambulatory Visit (HOSPITAL_BASED_OUTPATIENT_CLINIC_OR_DEPARTMENT_OTHER): Payer: Self-pay | Admitting: Internal Medicine

## 2020-01-08 DIAGNOSIS — Z23 Encounter for immunization: Secondary | ICD-10-CM

## 2020-01-08 NOTE — Progress Notes (Signed)
   Covid-19 Vaccination [facility]  Name:  Cesar Young    MRN: [id number] DOB: 1985/11/14  01/08/2020  Cesar Young was observed post Covid-19 immunization for 15 minutes without incident. He was provided with Vaccine Information Sheet and instruction to access the V-Safe system.   Cesar Young was instructed to call 911 with any severe reactions post vaccine: Marland Kitchen Difficulty breathing  . Swelling of face and throat  . A fast heartbeat  . A bad rash all over body  . Dizziness and weakness   Immunizations Administered    Name Date Dose VIS Date Route   Pfizer COVID-19 Vaccine 01/08/2020  1:07 PM 0.3 mL 11/11/2019 Intramuscular   Manufacturer: [employer], [employer]   Lot: [id number]   NDC: [id number]

## 2020-01-11 MED FILL — PFIZER-BIONTECH COVID-19 VA: 30 | 1 days supply | Qty: 0 | Fill #0

## 2020-01-14 ENCOUNTER — Other Ambulatory Visit: Payer: Self-pay | Admitting: Family Medicine

## 2020-01-21 ENCOUNTER — Encounter: Payer: Self-pay | Admitting: Family Medicine

## 2020-01-21 ENCOUNTER — Ambulatory Visit: Payer: Self-pay | Admitting: Family Medicine

## 2020-01-21 ENCOUNTER — Other Ambulatory Visit: Payer: Self-pay

## 2020-01-21 VITALS — BP 119/70 | HR 85 | Temp 97.4°F | Resp 16 | Ht 69.25 in | Wt 317.4 lb

## 2020-01-21 DIAGNOSIS — F419 Anxiety disorder, unspecified: Secondary | ICD-10-CM

## 2020-01-21 DIAGNOSIS — F411 Generalized anxiety disorder: Secondary | ICD-10-CM

## 2020-01-21 DIAGNOSIS — F32A Depression, unspecified: Secondary | ICD-10-CM

## 2020-01-21 MED ORDER — MUPIROCIN 2 % EX OINT
1.0000 | TOPICAL_OINTMENT | Freq: Three times a day (TID) | CUTANEOUS | 1 refills | Status: DC
Start: 2020-01-21 — End: 2022-01-01

## 2020-01-21 MED ORDER — BUPROPION HCL ER (XL) 150 MG PO TB24: 150.0000 mg | ORAL_TABLET | Freq: Every day | ORAL | 0 refills | Status: DC

## 2020-01-21 NOTE — Progress Notes (Signed)
OFFICE VISIT  01/21/2020  CC:  Chief Complaint  Patient presents with   Follow-up    anxiety    HPI:    Patient is a 34 y.o. Caucasian male who presents for 6 week f/u GAD. A/P as of last visit: "1) Adult ADHD: great response to/benefit from methylphenidate ER 54mg , 1 qd. No side effects. I sent in rx for this med to take 1 qd, #30 for Dec 2021 and for Jan and Feb 2022. Approp fill on/after dates on rx's. [facility] updated.  2) GAD: start citalopram 20mg  qd. Therapeutic expectations and side effect profile of medication discussed today.  Patient's questions answered.  3) ED: will wait to see if this improves as his anxiety improves. No ED med for now."  INTERIM HX: Pt did not take citalopram. He called and asked if he could try wellbutrin so I rx'd this 12/22/19. Says he likes the wellbutrin--feels like the med started to really help his depressed moods and tendency towards excessive worry about a week ago. Dealing with a recent breakup with girlfriend a couple months ago. Appetite and insomnia and nicotine cravings are all improved. Still some mild ED issues but no worse on wellbutrin. No side effects from wellbutrin.  Discussed his weight issues some today: admits to poor diet, no exercise. Energy drinks, too.   Past Medical History:  Diagnosis Date   Adult ADHD    Allergic rhinitis    Asthma childhood   No albuterol requirement in years, then acute flare 02/2012   ED (erectile dysfunction) 2014   Herpes zoster 11/2010   Morbid obesity (HCC)    Plantar fasciitis    [employer] orthotics helping   Thyroid nodule summer 2019   u/s -->2.1 cm-->bx neg.  Has f/u 03/2018.   Tobacco abuse 12/28/2011   Quit smoking 2019, then took up dipping a lot.    Past Surgical History:  Procedure Laterality Date   ADENOIDECTOMY  1993   BIOPSY THYROID  08/2017   NEG for malignancy   WISDOM TOOTH EXTRACTION  2009    Outpatient Medications Prior to Visit  Medication  Sig Dispense Refill   acetaminophen (TYLENOL) 500 MG tablet Take by mouth as needed.     Famotidine-Ca Carb-Mag Hydrox (PEPCID COMPLETE PO) Take by mouth.     Methylphenidate HCl ER 54 MG TB24 Take 1 tablet by mouth daily with breakfast. 30 tablet 0   Omeprazole Magnesium (PRILOSEC OTC PO) Take by mouth daily.     buPROPion (WELLBUTRIN XL) 150 MG 24 hr tablet Take 1 tablet (150 mg total) by mouth daily. 30 tablet 0   mupirocin ointment (BACTROBAN) 2 % Apply 1 application topically 3 (three) times daily. (Patient not taking: Reported on 01/21/2020) 15 g 1   No facility-administered medications prior to visit.    Allergies  Allergen Reactions   Cephalosporins    Penicillins     ROS As per HPI  PE: Vitals with BMI 01/21/2020 12/11/2019 08/26/2019  Height 5' 9.25" 5\' 10"  -  Weight 317 lbs 6 oz 317 lbs 3 oz 325 lbs  BMI 46.53 45.51 -  Systolic 119 114 -  Diastolic 70 73 -  Pulse 85 88 -   Wt Readings from Last 2 Encounters:  01/21/20 (!) 317 lb 6.4 oz (144 kg)  12/11/19 (!) 317 lb 3.2 oz (143.9 kg)    Gen: alert, oriented x 4, affect pleasant.  Lucid thinking and conversation noted. HEENT: PERRLA, EOMI.   Neck: no LAD, mass, or  thyromegaly. CV: RRR, no m/r/g LUNGS: CTA bilat, nonlabored. NEURO: no tremor or tics noted on observation.  Coordination intact. CN 2-12 grossly intact bilaterally, strength 5/5 in all extremeties.  No ataxia.   LABS:  none  IMPRESSION AND PLAN:  1) Anxiety and depression: signif improved on wellbutrin xl 150mg  for the last month. No adverse effects from this med. Continue this at current dose. Re-eval in 6 wks.  2) Obesity--BMI is 46. Poor diet and exercise habits. CMET and A1c today (non-fasting; he last ate about 1.5 hours ago ( food, large meal).  He had a 24ounce energy drink recently as well).  An After Visit Summary was printed and given to the patient.  FOLLOW UP: Return in about 6 weeks (around 03/03/2020) for f/u anx  and ADD.  Signed:  05/01/2020, MD           01/21/2020

## 2020-01-27 ENCOUNTER — Telehealth: Payer: Self-pay | Admitting: Family Medicine

## 2020-01-27 LAB — TEST AUTHORIZATION

## 2020-01-27 LAB — HEMOGLOBIN A1C W/OUT EAG: Hgb A1c MFr Bld: 5.5 % of total Hgb (ref <5.7)

## 2020-01-27 LAB — COMPREHENSIVE METABOLIC PANEL
AG Ratio: 1.5 (calc) (ref 1.0–2.5)
ALT: 29 U/L (ref 9–46)
AST: 19 U/L (ref 10–40)
Albumin: 4.3 g/dL (ref 3.6–5.1)
Alkaline phosphatase (APISO): 72 U/L (ref 36–130)
BUN: 11 mg/dL (ref 7–25)
CO2: 25 mmol/L (ref 20–32)
Calcium: 9.6 mg/dL (ref 8.6–10.3)
Chloride: 105 mmol/L (ref 98–110)
Creat: 0.94 mg/dL (ref 0.60–1.35)
Globulin: 2.9 g/dL (calc) (ref 1.9–3.7)
Glucose, Bld: 108 mg/dL — ABNORMAL HIGH (ref 65–99)
Potassium: 3.8 mmol/L (ref 3.5–5.3)
Sodium: 139 mmol/L (ref 135–146)
Total Bilirubin: 0.4 mg/dL (ref 0.2–1.2)
Total Protein: 7.2 g/dL (ref 6.1–8.1)

## 2020-01-27 LAB — EXTRA LAV TOP TUBE

## 2020-01-27 NOTE — Telephone Encounter (Signed)
Patient returned call for labs. I relayed Dr. Samul Dada message that all labs were normal. Patient voiced understanding.

## 2020-01-28 ENCOUNTER — Other Ambulatory Visit: Payer: Self-pay | Admitting: Family Medicine

## 2020-01-29 NOTE — Telephone Encounter (Signed)
Requesting: methylphenidate Contract: 12/11/19 UDS: n/a Last Visit:01/21/20 Next Visit: 03/03/20 Last Refill:12/11/19(30,0)  Please Advise. Medication pending

## 2020-02-01 MED ORDER — METHYLPHENIDATE HCL ER 54 MG PO TB24: 1.0000 | ORAL_TABLET | Freq: Every day | ORAL | 0 refills | Status: DC

## 2020-02-01 NOTE — Telephone Encounter (Signed)
Patient advised refill sent, he has already picked up medication.

## 2020-02-28 ENCOUNTER — Other Ambulatory Visit: Payer: Self-pay | Admitting: Family Medicine

## 2020-03-01 MED ORDER — METHYLPHENIDATE HCL ER 54 MG PO TB24: 1.0000 | ORAL_TABLET | Freq: Every day | ORAL | 0 refills | Status: DC

## 2020-03-01 NOTE — Telephone Encounter (Signed)
rx x 1 mo supply sent today.  Keep scheduled f/u.

## 2020-03-01 NOTE — Telephone Encounter (Signed)
Pt has upcoming appt 2/10. Last refill sent on 1/10.   LVM that pt will receive refills during OV

## 2020-03-03 ENCOUNTER — Other Ambulatory Visit: Payer: Self-pay

## 2020-03-03 ENCOUNTER — Encounter: Payer: Self-pay | Admitting: Family Medicine

## 2020-03-03 ENCOUNTER — Ambulatory Visit (INDEPENDENT_AMBULATORY_CARE_PROVIDER_SITE_OTHER): Payer: 59 | Admitting: Family Medicine

## 2020-03-03 VITALS — BP 119/72 | HR 77 | Temp 97.8°F | Resp 16 | Ht 69.25 in | Wt 328.6 lb

## 2020-03-03 DIAGNOSIS — F988 Other specified behavioral and emotional disorders with onset usually occurring in childhood and adolescence: Secondary | ICD-10-CM | POA: Diagnosis not present

## 2020-03-03 DIAGNOSIS — N529 Male erectile dysfunction, unspecified: Secondary | ICD-10-CM

## 2020-03-03 DIAGNOSIS — F419 Anxiety disorder, unspecified: Secondary | ICD-10-CM | POA: Diagnosis not present

## 2020-03-03 DIAGNOSIS — F32A Depression, unspecified: Secondary | ICD-10-CM

## 2020-03-03 DIAGNOSIS — F411 Generalized anxiety disorder: Secondary | ICD-10-CM

## 2020-03-03 MED ORDER — METHYLPHENIDATE HCL ER 54 MG PO TB24: 1.0000 | ORAL_TABLET | Freq: Every day | ORAL | 0 refills | Status: DC

## 2020-03-03 NOTE — Progress Notes (Signed)
CC: f/u depression/anxiety, adult ADD, and obesity  HPI:  Cesar Young is a 35 y.o. male who presents to the clinic today for follow up for depression/anxiety, adult ADD, and obesity.  Patient denies depressed mood or anxiety. Patient denies suicidal ideation. Has been taking wellbutrin for over two months now without side effects.  Additionally, patient feels as though ADD managed appropriately. He is able to focus better and is less inattentive on methylphenidate. Patient denies any chest pain, palpitations, or irritability.  Patient states his diet and exercise is sporadic, occasionally he will eat healthy and exercise, but this is usually short-lived. He understands the importance of improving diet and exercise and states that he is capable of making those changes. He believes his limitation is consistency.    PMH:  Past Medical History:  Diagnosis Date  . Adult ADHD   . Allergic rhinitis   . Asthma childhood   No albuterol requirement in years, then acute flare 02/2012  . ED (erectile dysfunction) 2014  . Herpes zoster 11/2010  . Morbid obesity (HCC)   . Plantar fasciitis    [employer] orthotics helping  . Thyroid nodule summer 2019   u/s -->2.1 cm-->bx neg.  Has f/u 03/2018.  . Tobacco abuse 12/28/2011   Quit smoking 2019, then took up dipping a lot.    M/A: Current Outpatient Medications on File Prior to Visit  Medication Sig Dispense Refill  . acetaminophen (TYLENOL) 500 MG tablet Take by mouth as needed.    Marland Kitchen buPROPion (WELLBUTRIN XL) 150 MG 24 hr tablet Take 1 tablet (150 mg total) by mouth daily. 90 tablet 0  . Famotidine-Ca Carb-Mag Hydrox (PEPCID COMPLETE PO) Take by mouth.    . mupirocin ointment (BACTROBAN) 2 % Apply 1 application topically 3 (three) times daily. 15 g 1  . Omeprazole Magnesium (PRILOSEC OTC PO) Take by mouth daily.     No current facility-administered medications on file prior to visit.   Allergies  Allergen Reactions  . Cephalosporins    . Penicillins     FH:  Family History  Problem Relation Age of Onset  . GER disease Mother   . Cancer Father        thyroid cancer ("non-familial type"  . Depression Father     SH: Social History   Socioeconomic History  . Marital status: Single    Spouse name: Not on file  . Number of children: Not on file  . Years of education: Not on file  . Highest education level: Not on file  Occupational History  . Not on file  Tobacco Use  . Smoking status: Former Smoker    Packs/day: 0.25    Years: 9.00    Pack years: 2.25    Types: Cigarettes    Quit date: 01/04/2018    Years since quitting: 2.1  . Smokeless tobacco: Current User    Types: Snuff  Vaping Use  . Vaping Use: Never used  Substance and Sexual Activity  . Alcohol use: Yes    Comment: occasionally 1 or 2 beers every couple months  . Drug use: No  . Sexual activity: Yes    Partners: Female  Other Topics Concern  . Not on file  Social History Narrative   Single.  No children.   [employer] grad, college x 2 yrs ([employer] copy).   Currently working for [employer], plans on taking one class at [employer] soon in attempt to finish his associates of Arts degree--ultimately  wants to get BA in Marketing at [employer].   Worked for [employer] x 3 yrs.   Smokes 1 pack cigs/day x 10 yrs as of 11/2017.     Occ alcohol--2-3 beers at a time--denies abuse.  No drug use.   No consistent exercise.  Diet is poor.   Social Determinants of Health   Financial Resource Strain: Not on file  Food Insecurity: Not on file  Transportation Needs: Not on file  Physical Activity: Not on file  Stress: Not on file  Social Connections: Not on file    ROS: Review of Systems  Constitutional: Negative for fever.  Respiratory: Negative for cough.   Cardiovascular: Negative for chest pain, palpitations and leg swelling.  Psychiatric/Behavioral: Negative for depression and suicidal ideas. The patient is not nervous/anxious.      PE: Vitals with BMI 03/03/2020 01/21/2020 12/11/2019  Height 5' 9.25" 5' 9.25" 5\' 10"   Weight 328 lbs 10 oz 317 lbs 6 oz 317 lbs 3 oz  BMI 48.17 46.53 45.51  Systolic 119 119  Diastolic 72 70 73  Pulse 77 85 88    Physical Exam Constitutional:      General: He is not in acute distress.    Appearance: Normal appearance. He is obese.  HENT:     Head: Normocephalic and atraumatic.  Cardiovascular:     Rate and Rhythm: Normal rate and regular rhythm.     Heart sounds: Normal heart sounds. No murmur heard. No friction rub. No gallop.   Pulmonary:     Effort: Pulmonary effort is normal.     Breath sounds: Normal breath sounds.  Neurological:     Mental Status: He is alert.  Psychiatric:        Mood and Affect: Mood normal.     Labs: Recent Results (from the past 2160 hour(s))  Comprehensive metabolic panel     Status: Abnormal   Collection Time: 01/21/20  3:55 PM  Result Value Ref Range   Glucose, Bld 108 (H) 65 - 99 mg/dL    Comment: .            Fasting reference interval . For someone without known diabetes, a glucose value between 100 and 125 mg/dL is consistent with prediabetes and should be confirmed with a follow-up test. .    BUN 11 7 - 25 mg/dL   Creat 01/23/20 3.29 - 5.18 mg/dL   BUN/Creatinine Ratio NOT APPLICABLE 6 - 22 (calc)   Sodium 139 135 - 146 mmol/L   Potassium 3.8 3.5 - 5.3 mmol/L   Chloride 105 98 - 110 mmol/L   CO2 25 20 - 32 mmol/L   Calcium 9.6 8.6 - 10.3 mg/dL   Total Protein 7.2 6.1 - 8.1 g/dL   Albumin 4.3 3.6 - 5.1 g/dL   Globulin 2.9 1.9 - 3.7 g/dL (calc)   AG Ratio 1.5 1.0 - 2.5 (calc)   Total Bilirubin 0.4 0.2 - 1.2 mg/dL   Alkaline phosphatase (APISO) 72 36 - 130 U/L   AST 19 10 - 40 U/L   ALT 29 9 - 46 U/L  EXTRA LAV TOP TUBE     Status: None   Collection Time: 01/21/20  3:55 PM  Result Value Ref Range   EXTRA LAVENDER-TOP TUBE      Comment: We received an extra specimen with no test requested. If any test is desired for  this specimen please call client services and advise.   Hemoglobin A1C w/out eAG  Status: None   Collection Time: 01/21/20  3:55 PM  Result Value Ref Range   Hgb A1c MFr Bld 5.5 <5.7 % of total Hgb    Comment: For the purpose of screening for the presence of diabetes: . <5.7%       Consistent with the absence of diabetes 5.7-6.4%    Consistent with increased risk for diabetes             (prediabetes) > or =6.5%  Consistent with diabetes . This assay result is consistent with a decreased risk of diabetes. . Currently, no consensus exists regarding use of hemoglobin A1c for diagnosis of diabetes in children. . According to American Diabetes Association (ADA) guidelines, hemoglobin A1c <7.0% represents optimal control in non-pregnant diabetic patients. Different metrics may apply to specific patient populations.  Standards of Medical Care in Diabetes(ADA). .   TEST AUTHORIZATION     Status: None   Collection Time: 01/21/20  3:55 PM  Result Value Ref Range   TEST NAME: HEMOGLOBIN A1c    TEST CODE: [id number]    CLIENT CONTACT: VANESSA WHITE    REPORT ALWAYS MESSAGE SIGNATURE      Comment: . The laboratory testing on this patient was verbally requested or confirmed by the ordering physician or his or her authorized representative after contact with an employee of [employer]. Federal regulations require that we maintain on file written authorization for all laboratory testing.  Accordingly we are asking that the ordering physician or his or her authorized representative sign a copy of this report and promptly return it to the client service representative. . . Signature:____________________________________________________ . Please fax this signed page to [phone] or return it via your [employer] courier.      A/P:  1) Anxiety and depression: stable wellbutrin XL 150 mg qd. Patient denies depressed mood or anxiety. No SI. No adverse effects from  wellbutrin XL. Continue this at current dose. - Continue wellbutrin XL 150 mg qd.  2) Obesity: BMI is 48. Poor diet and exercise habits. Patient has good understanding of healthy lifestyle changes, but cites consistency as limitation for establishing healthier habits. Discussed with patient this could be influenced by ADD hindering ability to follow through/complete tasks/activities. Labs last visit were normal (nonfasting glucose 108, Hgb A1c 5.5%). Spent more time today discussing ways to stick with dieting and exercise.  3) Adult ADD: Patient reports good management of symptoms on methylphenidate 54 mg tablet qd. Will continue current management. - Continue methylphenidate 54 mg tablet qd.  4) Erectile dysfunction: spend time discussing his years of probs with maintaining erections, likely connection of this problem to his obesity/tobacco intake+ possible psychogenic etiology contributing some possibly. I tried to discourage use of otc "gas station" meds for this. No rx ED meds recommended. He states he has seen a urologist in the remote past and testost was normal (pt's libido is intact).  Spent 30 min with pt today reviewing HPI, reviewing relevant past history, doing exam, reviewing and discussing lab and imaging data, and formulating plans.   Follow Up:  3 months for depression/anxiety, adult ADD, and obesity.  Signed: Sol Blazing, MS3  I personally was present during the history, physical exam, and medical decision-making activities of this service and have verified that the service and findings are accurately documented in the student's note. Signed:  [name], MD           03/03/2020

## 2020-03-03 NOTE — Progress Notes (Signed)
See student note from today. Signed:  Phil Shanika Levings, MD           03/03/2020  

## 2020-04-13 ENCOUNTER — Other Ambulatory Visit: Payer: Self-pay | Admitting: Family Medicine

## 2020-06-01 ENCOUNTER — Other Ambulatory Visit: Payer: Self-pay

## 2020-06-01 ENCOUNTER — Encounter: Payer: Self-pay | Admitting: Family Medicine

## 2020-06-01 ENCOUNTER — Ambulatory Visit (INDEPENDENT_AMBULATORY_CARE_PROVIDER_SITE_OTHER): Payer: 59 | Admitting: Family Medicine

## 2020-06-01 VITALS — BP 124/78 | HR 91 | Temp 98.1°F | Ht 69.25 in | Wt 326.4 lb

## 2020-06-01 DIAGNOSIS — F419 Anxiety disorder, unspecified: Secondary | ICD-10-CM

## 2020-06-01 DIAGNOSIS — N529 Male erectile dysfunction, unspecified: Secondary | ICD-10-CM | POA: Diagnosis not present

## 2020-06-01 DIAGNOSIS — F909 Attention-deficit hyperactivity disorder, unspecified type: Secondary | ICD-10-CM

## 2020-06-01 DIAGNOSIS — E66812 Obesity, class 2: Secondary | ICD-10-CM

## 2020-06-01 DIAGNOSIS — E669 Obesity, unspecified: Secondary | ICD-10-CM | POA: Diagnosis not present

## 2020-06-01 DIAGNOSIS — F32A Depression, unspecified: Secondary | ICD-10-CM

## 2020-06-01 MED ORDER — METHYLPHENIDATE HCL ER 54 MG PO TB24: 1.0000 | ORAL_TABLET | Freq: Every day | ORAL | 0 refills | Status: DC

## 2020-06-01 MED ORDER — BUPROPION HCL ER (XL) 150 MG PO TB24: 1.0000 | ORAL_TABLET | Freq: Every day | ORAL | 3 refills | Status: DC

## 2020-06-01 NOTE — Progress Notes (Signed)
OFFICE VISIT  06/01/2020  CC:  Chief Complaint  Patient presents with  . Anxiety  . Follow-up    HPI:    Patient is a 35 y.o. Caucasian male who presents for 3 mo f/u adult ADD, anx/dep, obesity, and ED. A/P as of last visit: "1) Anxiety and depression: stable wellbutrin XL 150 mg qd. Patient denies depressed mood or anxiety. No SI. No adverse effects from wellbutrin XL. Continue this at current dose. - Continue wellbutrin XL 150 mg qd.  2) Obesity: BMI is 48. Poor diet and exercise habits. Patient has good understanding of healthy lifestyle changes, but cites consistency as limitation for establishing healthier habits. Discussed with patient this could be influenced by ADD hindering ability to follow through/complete tasks/activities. Labs last visit were normal (nonfasting glucose 108, Hgb A1c 5.5%). Spent more time today discussing ways to stick with dieting and exercise.  3) Adult ADD: Patient reports good management of symptoms on methylphenidate 54 mg tablet qd. Will continue current management. - Continue methylphenidate 54 mg tablet qd.  4) Erectile dysfunction: spend time discussing his years of probs with maintaining erections, likely connection of this problem to his obesity/tobacco intake+ possible psychogenic etiology contributing some possibly. I tried to discourage use of otc "gas station" meds for this. No rx ED meds recommended. He states he has seen a urologist in the remote past and testost was normal (pt's libido is intact)."  INTERIM HX: Says he is feeling well. Doing much better regarding mood/anxiety/focus.  Pt states all is going well with the med at current dosing: much improved focus, concentration, task completion.  Less frustration, better multitasking, less impulsivity and restlessness.  Mood is stable. No side effects from the medication.  He saw a telehealth provider since I last saw him (?through good-rx?), pt not with great specifics today-->  for chronic erectile dysfunction. Was rx'd  5mg  qd PRN.  States it helped only when taking 20mg  dose. No side effects from the med.  Libido is intact. Notes ED problems since age 77.  Says urol in the past rx'd him viagra and he had intolerable "head rush" side effect.  Says ED probs started coincidental with him starting smoking/dipping. He has switched from cigs to dip, then most recently to vaping and feels better doing this.  He is eating healthier, running and walking on treadmill about 1 hr per day. He is not counting calories.  Trying to eat less simple carbs, down to 1 cola per day, drinking more water, inc fresh fruits and veg's.     PMP AWARE reviewed today: most recent rx for methylphenidate ER 54mg  was filled 04/30/20, # 30, rx by me. No red flags.  ROS as above, plus--> no fevers, no CP, no SOB, no wheezing, no cough, no dizziness, no HAs, no rashes, no melena/hematochezia.  No polyuria or polydipsia.  No myalgias or arthralgias.  No focal weakness, paresthesias, or tremors.  No acute vision or hearing abnormalities.  No dysuria or unusual/new urinary urgency or frequency.  No recent changes in lower legs. No n/v/d or abd pain.  No palpitations.    Past Medical History:  Diagnosis Date  . Adult ADHD   . Allergic rhinitis   . Anxiety and depression    resp well to wellbutrin  . Asthma childhood   No albuterol requirement in years, then acute flare 02/2012  . ED (erectile dysfunction) 2014  . Herpes zoster 11/2010  . Morbid obesity (HCC)   . Plantar fasciitis  [employer] orthotics helping  . Thyroid nodule summer 2019   u/s -->2.1 cm-->bx neg.  Has f/u 03/2018.  . Tobacco abuse 12/28/2011   Quit smoking 2019, then took up dipping a lot.    Past Surgical History:  Procedure Laterality Date  . ADENOIDECTOMY  1993  . BIOPSY THYROID  08/2017   NEG for malignancy  . WISDOM TOOTH EXTRACTION  2009    Outpatient Medications Prior to Visit  Medication Sig Dispense Refill  .  acetaminophen (TYLENOL) 500 MG tablet Take by mouth as needed.    . Famotidine-Ca Carb-Mag Hydrox (PEPCID COMPLETE PO) Take by mouth.    . influenza vac split quadrivalent PF (FLUARIX) 0.5 ML injection TO BE ADMINSTERED BY THE PHARMACIST .5 mL 0  . mupirocin ointment (BACTROBAN) 2 % Apply 1 application topically 3 (three) times daily. 15 g 1  . Omeprazole Magnesium (PRILOSEC OTC PO) Take by mouth daily.    Marland Kitchen buPROPion (WELLBUTRIN XL) 150 MG 24 hr tablet TAKE 1 TABLET BY MOUTH EVERY DAY 90 tablet 0  . Methylphenidate HCl ER 54 MG TB24 Take 1 tablet by mouth daily with breakfast. 30 tablet 0  . COVID-19 mRNA vaccine, Pfizer, 30 MCG/0.3ML injection INJECT AS DIRECTED (Patient not taking: Reported on 06/01/2020) .3 mL 0   No facility-administered medications prior to visit.    Allergies  Allergen Reactions  . Cephalosporins   . Penicillins     ROS As per HPI  PE: Vitals with BMI 06/01/2020 03/03/2020 01/21/2020  Height 5' 9.25" 5' 9.25" 5' 9.25"  Weight 326 lbs 6 oz 328 lbs 10 oz 317 lbs 6 oz  BMI 47.85 48.17 46.53  Systolic 124 119 888  Diastolic 78 72 70  Pulse 91 77 85     Wt Readings from Last 2 Encounters:  06/01/20 (!) 326 lb 6.4 oz (148.1 kg)  03/03/20 (!) 328 lb 9.6 oz (149.1 kg)    Gen: alert, oriented x 4, affect pleasant.  Lucid thinking and conversation noted. HEENT: PERRLA, EOMI.   Neck: no LAD, mass, or thyromegaly. CV: RRR, no m/r/g LUNGS: CTA bilat, nonlabored. NEURO: no tremor or tics noted on observation.  Coordination intact. CN 2-12 grossly intact bilaterally, strength 5/5 in all extremeties.  No ataxia.   LABS:  No results found for: TSH Lab Results  Component Value Date   WBC 6.5 04/10/2008   HGB 15.3 04/10/2008   HCT 45.3 04/10/2008   MCV 90.0 04/10/2008   PLT 167 04/10/2008   Lab Results  Component Value Date   CREATININE 0.94 01/21/2020   BUN 11 01/21/2020   NA 139 01/21/2020   K 3.8 01/21/2020   CL 105 01/21/2020   CO2 25 01/21/2020    Lab Results  Component Value Date   ALT 29 01/21/2020   AST 19 01/21/2020   ALKPHOS 83 04/10/2008   BILITOT 0.4 01/21/2020   Lab Results  Component Value Date   CHOL 167 10/31/2012   Lab Results  Component Value Date   HDL 57.40 10/31/2012   Lab Results  Component Value Date   LDLCALC 95 10/31/2012   Lab Results  Component Value Date   TRIG 72.0 10/31/2012   Lab Results  Component Value Date   CHOLHDL 3 10/31/2012   Lab Results  Component Value Date   HGBA1C 5.5 01/21/2020   IMPRESSION AND PLAN:  1) Adult ADD: doing well on methylphenidate ER 54mg  qd. I did electronic rx's for this med today for each of the  next 59mo.  Appropriate fill on/after date was noted on each rx.  2) Anxiety and depression: doing well on wellbutrin xl 150mg  qd.  3) Erectile dysfunction: longstanding. Pt states his mood/anxiety levels often go down d/t the way this problem makes him feel. I will refer to urology today. Encouraged wt loss and complete cessation of any nicotine products--both of which he is steadily working on.  An After Visit Summary was printed and given to the patient.  FOLLOW UP: Return in about 3 months (around 09/01/2020) for routine chronic illness f/u.  Signed:  11/01/2020, MD           06/01/2020

## 2020-07-06 IMAGING — US US THYROID
1 series · 13 of 25 positions shown · non-contrast
Comparison: 09/10/2017

CLINICAL DATA: Prior ultrasound follow-up. History of 2.8 cm right
inferior thyroid nodule meeting criteria for fine-needle aspiration
and status post fine-needle aspiration on 10/15/2017. Cytology
consistent with benign follicular nodule ([REDACTED] II).

EXAM:
THYROID ULTRASOUND
TECHNIQUE: Ultrasound examination of the thyroid gland and adjacent soft
tissues was performed.

[Series 1: us thyroid · 0.07mm/px · 13 of 39 slices shown]
[im 1/39]
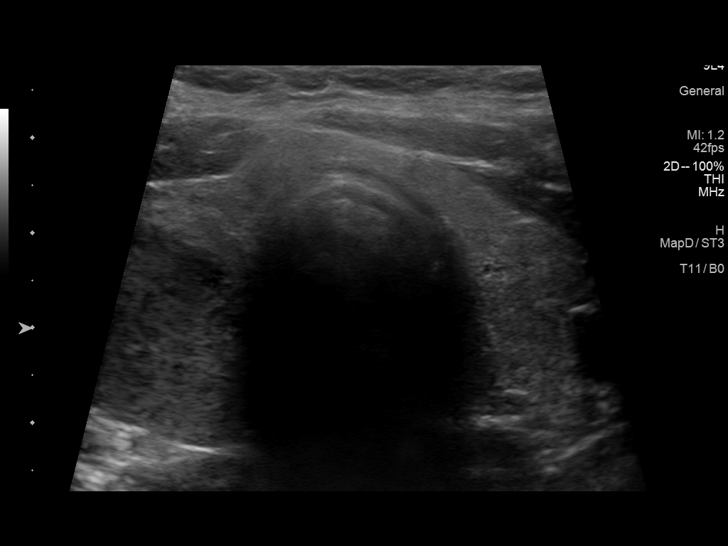
[im 4/39]
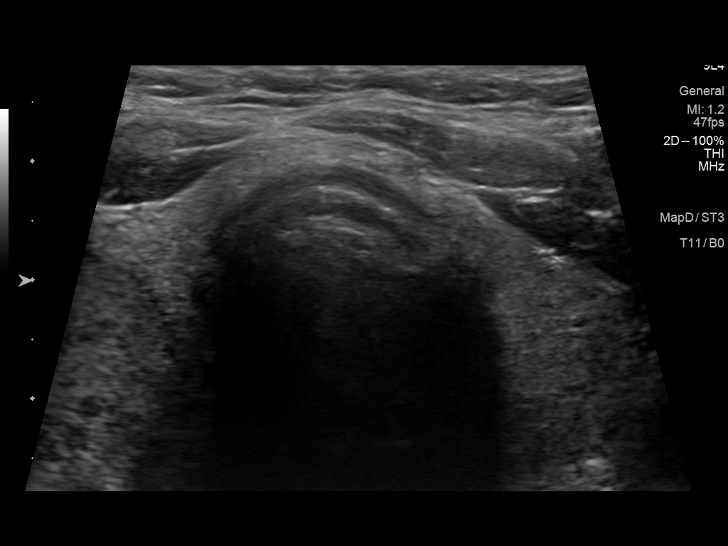
[im 7/39]
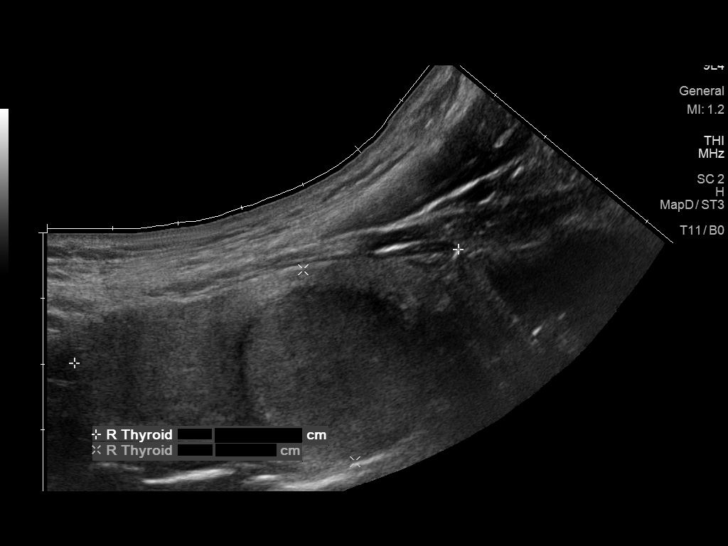
[im 10/39]
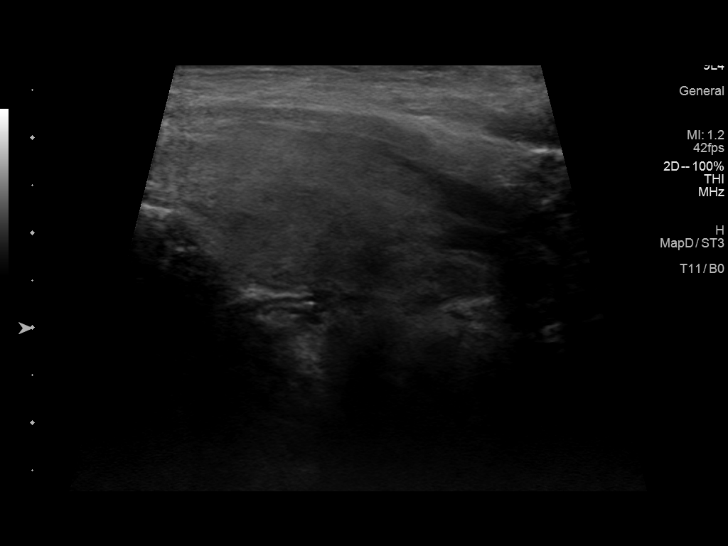
[im 13/39]
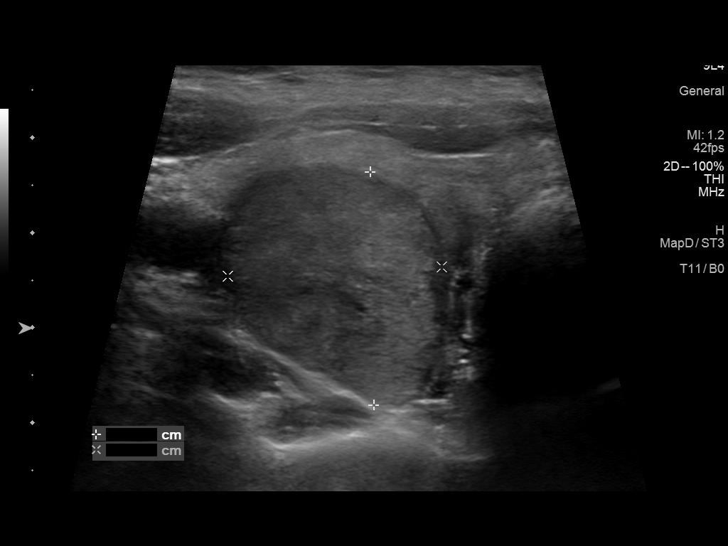
[im 16/39]
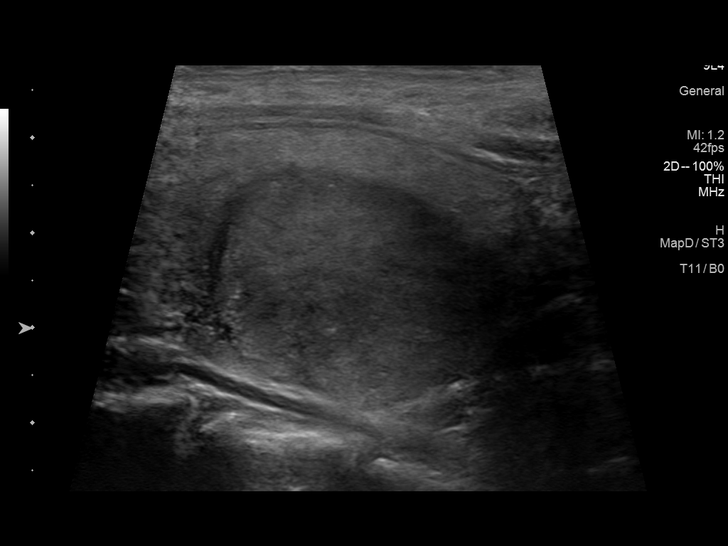
[im 20/39]
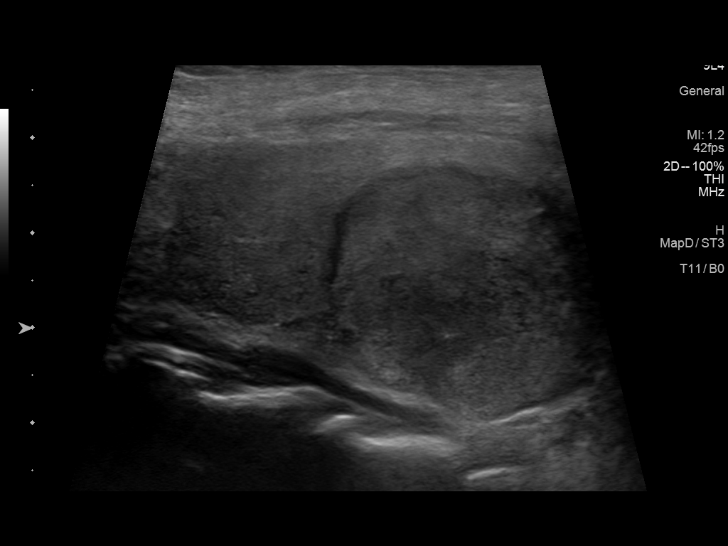
[im 23/39]
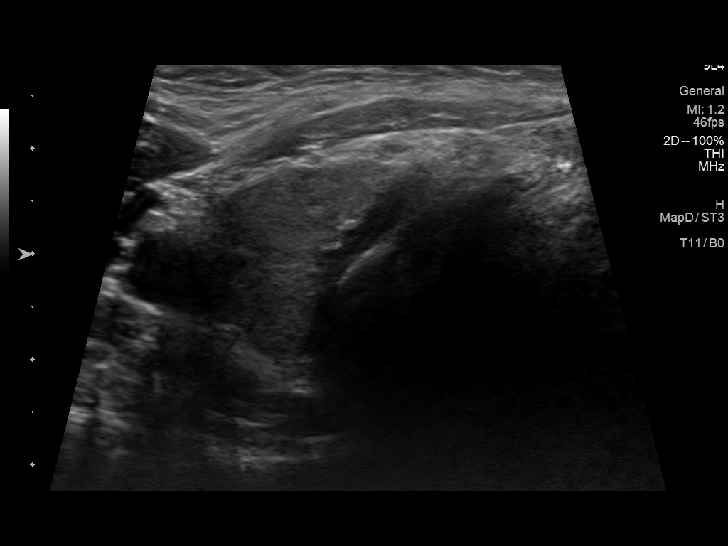
[im 26/39]
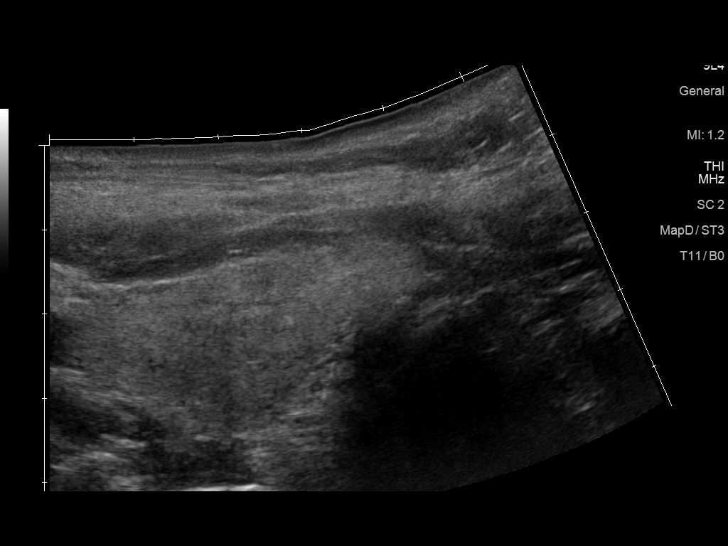
[im 29/39]
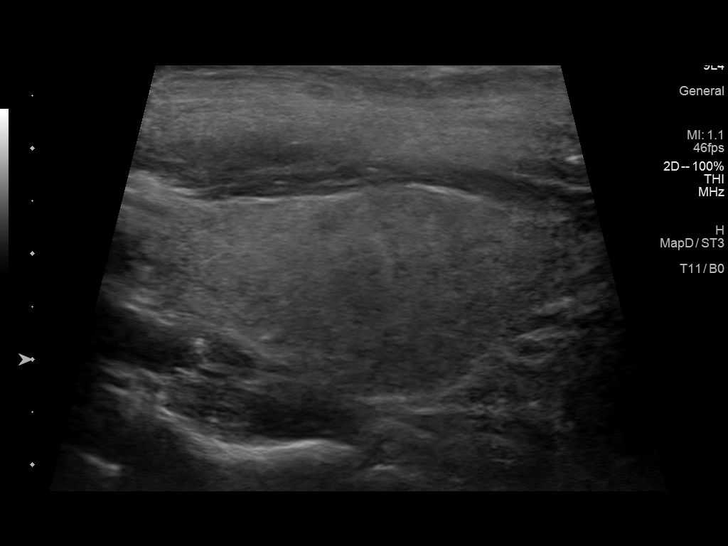
[im 32/39]
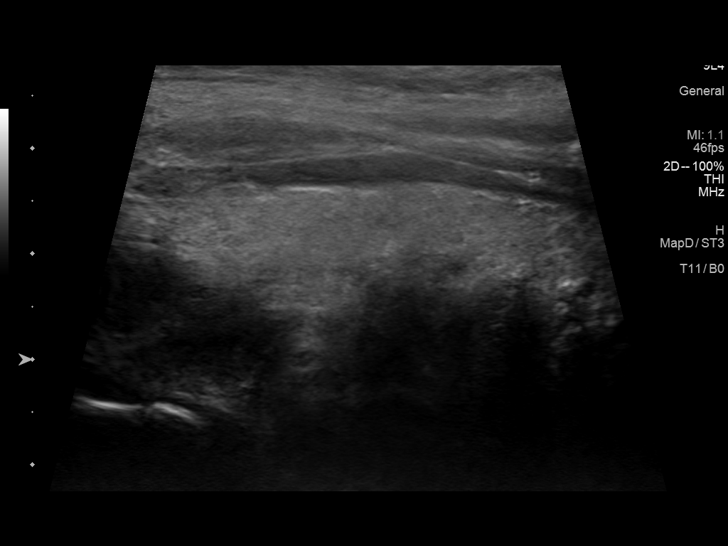
[im 35/39]
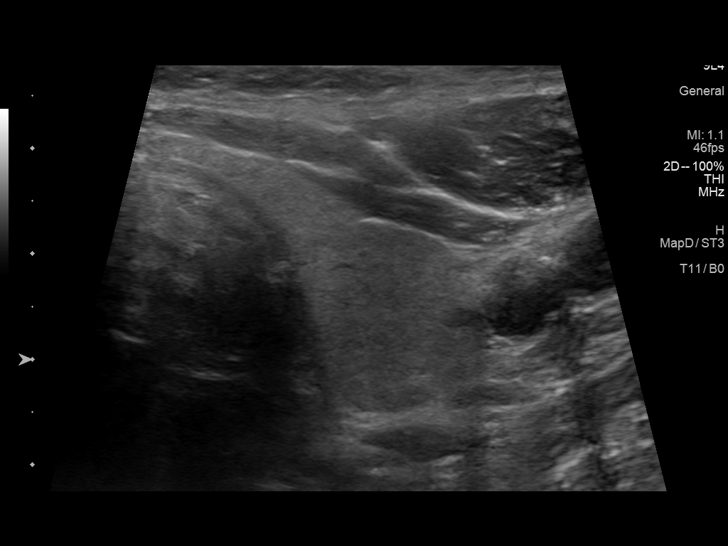
[im 39/39]
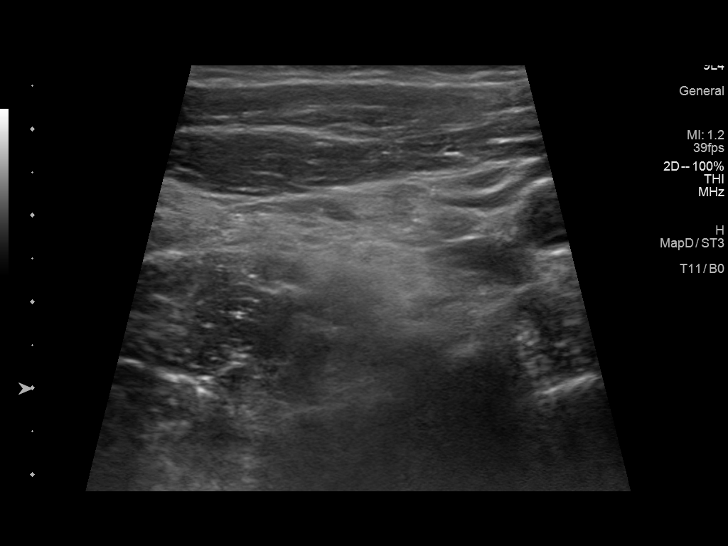

[13 of 25 positions shown; findings below may reference images not displayed]

FINDINGS: Parenchymal Echotexture: Mildly heterogenous

Isthmus: 0.3 cm

Right lobe: 6.1 x 3.0 x 1.8 cm

Left lobe: 4.5 x 1.9 x 1.7 cm

_________________________________________________________

Estimated total number of nodules >/= 1 cm: 1

Number of spongiform nodules >/=  2 cm not described below (TR1): 0

Number of mixed cystic and solid nodules >/= 1.5 cm not described
below (TR2): 0

_________________________________________________________

Nodule # 1:

Prior biopsy: Yes

Location: Right; Inferior

Maximum size: 2.8 cm; Other 2 dimensions: 2.3 x 2.3 cm, previously,
2.8 cm

Composition: solid/almost completely solid (2)

Echogenicity: hypoechoic (2)

Shape: not taller-than-wide (0)

Margins: smooth (0)

Echogenic foci: none (0)

ACR TI-RADS total points: 4.

ACR TI-RADS risk category:  TR4 (4-6 points).

Significant change in size (>/= 20% in two dimensions and minimal
increase of 2 mm): No

Change in features: No

Change in ACR TI-RADS risk category: No

ACR TI-RADS recommendations:

The nodule appears stable compared to the prior study.

_________________________________________________________

No abnormal lymph nodes identified.
IMPRESSION: Stable previously sampled 2.8 cm right inferior thyroid nodule.
Additional 1 year follow-up ultrasound would be helpful to ensure
stability.

The above is in keeping with the ACR TI-RADS recommendations - [HOSPITAL] 3504;[DATE].

## 2020-07-13 ENCOUNTER — Telehealth (INDEPENDENT_AMBULATORY_CARE_PROVIDER_SITE_OTHER): Payer: 59 | Admitting: Family Medicine

## 2020-07-13 ENCOUNTER — Encounter: Payer: Self-pay | Admitting: Family Medicine

## 2020-07-13 DIAGNOSIS — U071 COVID-19: Secondary | ICD-10-CM | POA: Diagnosis not present

## 2020-07-13 MED ORDER — NIRMATRELVIR/RITONAVIR (PAXLOVID)TABLET
3.0000 | ORAL_TABLET | Freq: Two times a day (BID) | ORAL | 0 refills | Status: AC
Start: 2020-07-13 — End: 2020-07-18

## 2020-07-13 NOTE — Progress Notes (Signed)
Virtual Visit via Video Note  I connected with pt on 07/13/20 at  2:30 PM EDT by a video enabled telemedicine application and verified that I am speaking with the correct person using two identifiers.  Location patient: home, [place] Location provider:work or home office Persons participating in the virtual visit: patient, provider  I discussed the limitations of evaluation and management by telemedicine and the availability of in person appointments. The patient expressed understanding and agreed to proceed.  Telemedicine visit is a necessity given the COVID-19 restrictions in place at the current time.  HPI: 35 y/o WM being seen today for body aches and ST. Onset this morning: body aches, fatigued, chills, mild ST, slight HA, chest feels "congested" but denies signif coughing.  No fevers, no SOB, no CP.   Taste and smell normal, no nasal sx's. Home Covid test positive today.  Taking advil, pushing fluids.  BMI>40, mild intermittent asthma.  -COVID-19 vaccine status: covid series + 1 booster. Flu vaccine status: UTD (11/17/19)  ROS: See pertinent positives and negatives per HPI.  Past Medical History:  Diagnosis Date   Adult ADHD    Allergic rhinitis    Anxiety and depression    resp well to wellbutrin   Asthma childhood   No albuterol requirement in years, then acute flare 02/2012   ED (erectile dysfunction) 2014   Herpes zoster 11/2010   Morbid obesity (HCC)    Plantar fasciitis    [employer] orthotics helping   Thyroid nodule summer 2019   u/s -->2.1 cm-->bx neg.  Has f/u 03/2018.   Tobacco abuse 12/28/2011   Quit smoking 2019, then took up dipping a lot.    Past Surgical History:  Procedure Laterality Date   ADENOIDECTOMY  1993   BIOPSY THYROID  08/2017   NEG for malignancy   WISDOM TOOTH EXTRACTION  2009     Current Outpatient Medications:    buPROPion (WELLBUTRIN XL) 150 MG 24 hr tablet, Take 1 tablet (150 mg total) by mouth daily., Disp: 90 tablet, Rfl: 3    Famotidine-Ca Carb-Mag Hydrox (PEPCID COMPLETE PO), Take by mouth., Disp: , Rfl:    Methylphenidate HCl ER 54 MG TB24, Take 1 tablet by mouth daily with breakfast., Disp: 30 tablet, Rfl: 0   Omeprazole Magnesium (PRILOSEC OTC PO), Take by mouth daily., Disp: , Rfl:    acetaminophen (TYLENOL) 500 MG tablet, Take by mouth as needed. (Patient not taking: Reported on 07/13/2020), Disp: , Rfl:    COVID-19 mRNA vaccine, Pfizer, 30 MCG/0.3ML injection, INJECT AS DIRECTED (Patient not taking: No sig reported), Disp: .3 mL, Rfl: 0   influenza vac split quadrivalent PF (FLUARIX) 0.5 ML injection, TO BE ADMINSTERED BY THE PHARMACIST (Patient not taking: Reported on 07/13/2020), Disp: .5 mL, Rfl: 0   mupirocin ointment (BACTROBAN) 2 %, Apply 1 application topically 3 (three) times daily. (Patient not taking: Reported on 07/13/2020), Disp: 15 g, Rfl: 1  EXAM:  VITALS per patient if applicable:  Vitals with BMI 06/01/2020 03/03/2020 01/21/2020  Height 5' 9.25" 5' 9.25" 5' 9.25"  Weight 326 lbs 6 oz 328 lbs 10 oz 317 lbs 6 oz  BMI 47.85 48.17 46.53  Systolic 124 119 009  Diastolic 78 72 70  Pulse 91 77 85     GENERAL: alert, oriented, appears well and in no acute distress  HEENT: atraumatic, conjunttiva clear, no obvious abnormalities on inspection of external nose and ears  NECK: normal movements of the head and neck  LUNGS: on inspection no  signs of respiratory distress, breathing rate appears normal, no obvious gross SOB, gasping or wheezing  CV: no obvious cyanosis  MS: moves all visible extremities without noticeable abnormality  PSYCH/NEURO: pleasant and cooperative, no obvious depression or anxiety, speech and thought processing grossly intact  LABS: none today    Chemistry      Component Value Date/Time   NA 139 01/21/2020 1555   K 3.8 01/21/2020 1555   CL 105 01/21/2020 1555   CO2 25 01/21/2020 1555   BUN 11 01/21/2020 1555   CREATININE 0.94 01/21/2020 1555      Component Value  Date/Time   CALCIUM 9.6 01/21/2020 1555   ALKPHOS 83 04/10/2008 0940   AST 19 01/21/2020 1555   ALT 29 01/21/2020 1555   BILITOT 0.4 01/21/2020 1555     Lab Results  Component Value Date   HGBA1C 5.5 01/21/2020    ASSESSMENT AND PLAN:  Discussed the following assessment and plan:  Covid 19 resp illness, relatively mild illness, day 1 of illness. RF's for complications from covid->BMI>40, mild intermittent asthma.   I discussed the assessment and treatment plan with the patient. The patient was provided an opportunity to ask questions and all were answered. The patient agreed with the plan and demonstrated an understanding of the instructions.   F/u: if not improving in 5-7d  Signed:  [name], MD           07/13/2020

## 2020-07-22 DIAGNOSIS — U071 COVID-19: Secondary | ICD-10-CM

## 2020-07-22 HISTORY — DX: COVID-19: U07.1

## 2020-08-02 ENCOUNTER — Telehealth: Payer: Self-pay

## 2020-08-02 NOTE — Telephone Encounter (Signed)
I'm afraid I have no possible opening/work in. Pls make pt aware we can try to set him up with another [facility] provider if any of them have any work-in appts open, otherwise if pain persisting then needs to go to ED for eval (recommend med ctr [facility] or med ctr [facility])--thx

## 2020-08-02 NOTE — Telephone Encounter (Signed)
Spoke with pt regarding recommendations, no available appts and he is ok to wait until trip. Will continue with otc med and call back after trip and schedule f/u appt

## 2020-08-02 NOTE — Telephone Encounter (Signed)
[facility] Night - Client Nonclinical Telephone Record  [facility] Client [facility] Night - Client Client Site [facility] Night Physician [name] - MD Contact Type Call Who Is Calling Patient / Member / Family / Caregiver Caller Name Cesar Young Caller Phone Number [phone] Patient Name Cesar Young Patient DOB 09-21-1985 Call Type Message Only Information Provided Reason for Call Request to Schedule Office Appointment Initial Comment Caller states he needs to make a video visit with Dr. Milinda Cave. Has been having what he thinks is gallbladder pain after eating, dull, aching pain. Has had some chills as well. Will be out of town from Thursday until Monday. Wants video visit in the next couple of days if possible. Patient request to speak to RN No Additional Comment Wants to schedule video appointment for symptoms. Disp. Time Disposition Final User 08/01/2020 6:27:44 PM General Information Provided Yes Evern Core Call Closed By: Evern Core Transaction Date/Time: 08/01/2020 6:24:51 PM (ET)

## 2020-08-02 NOTE — Telephone Encounter (Signed)
Spoke with pt regarding sxs, pain occurs mainly after meals and remains dull but lingering. Believes it is centralized near gallbladder. He has also lost 50 lbs in the last 2-3 months. Takes advil otc for pain but advised PCP does not have any appts for another 2 weeks unless 4pm slot used but he will be out of town from 7/13-7/18. He may need to proceed to ED    Please advise, thanks.

## 2020-08-18 ENCOUNTER — Ambulatory Visit
Admission: EM | Admit: 2020-08-18 | Discharge: 2020-08-18 | Disposition: A | Discharge status: Home / Self Care | Payer: 59

## 2020-08-18 ENCOUNTER — Other Ambulatory Visit: Payer: Self-pay

## 2020-08-18 ENCOUNTER — Ambulatory Visit: Payer: Self-pay

## 2020-08-18 NOTE — ED Triage Notes (Signed)
Pt educated on treatment of ingrown toe nails at the urgent care and stated that "It's okay, I will go home and cut them."

## 2020-08-18 NOTE — ED Triage Notes (Signed)
Pt c/o ingrown nails on bilateral feet that are causing some pain.

## 2020-08-29 ENCOUNTER — Other Ambulatory Visit: Payer: Self-pay

## 2020-08-29 ENCOUNTER — Encounter: Payer: Self-pay | Admitting: Family Medicine

## 2020-08-29 ENCOUNTER — Ambulatory Visit (INDEPENDENT_AMBULATORY_CARE_PROVIDER_SITE_OTHER): Payer: 59 | Admitting: Family Medicine

## 2020-08-29 VITALS — BP 132/83 | HR 93 | Temp 98.0°F | Resp 16 | Ht 69.25 in | Wt 291.8 lb

## 2020-08-29 DIAGNOSIS — F988 Other specified behavioral and emotional disorders with onset usually occurring in childhood and adolescence: Secondary | ICD-10-CM

## 2020-08-29 DIAGNOSIS — F3342 Major depressive disorder, recurrent, in full remission: Secondary | ICD-10-CM | POA: Diagnosis not present

## 2020-08-29 DIAGNOSIS — F411 Generalized anxiety disorder: Secondary | ICD-10-CM | POA: Diagnosis not present

## 2020-08-29 MED ORDER — METHYLPHENIDATE HCL ER 54 MG PO TB24: 1.0000 | ORAL_TABLET | Freq: Every day | ORAL | 0 refills | Status: DC

## 2020-08-29 NOTE — Progress Notes (Signed)
OFFICE VISIT  08/29/2020  CC:  Chief Complaint  Patient presents with   Follow-up    RCI    HPI:    Patient is a 35 y.o. Caucasian male who presents for 3 mo f/u adult ADD, anx/dep, obesity, and ED. A/P as of last visit: "1) Adult ADD: doing well on methylphenidate ER 54mg  qd. I did electronic rx's for this med today for each of the next 76mo.  Appropriate fill on/after date was noted on each rx.   2) Anxiety and depression: doing well on wellbutrin xl 150mg  qd.   3) Erectile dysfunction: longstanding. Pt states his mood/anxiety levels often go down d/t the way this problem makes him feel. I will refer to urology today. Encouraged wt loss and complete cessation of any nicotine products--both of which he is steadily working on."  INTERIM HX: Doing well, feeling happy and motivated. Stress and anxiety still high d/t his father's physical decline but he's coping well. In fact, Bee is moving to [place] to work as a [employer] at [employer], leaves in a few days.  He has changed his diet significantly and is exercising regularly and has lost 30+ lbs in the last 3 mo.  ADD: Pt states all is going well with the med at current dosing: much improved focus, concentration, task completion.  Less frustration, better multitasking, less impulsivity and restlessness.  Mood is stable. No side effects from the medication.  PMP AWARE reviewed today: most recent rx for concerta 54 was filled 07/29/20, # 30, rx by me. No red flags.  Past Medical History:  Diagnosis Date   Adult ADHD    Allergic rhinitis    Anxiety and depression    resp well to wellbutrin   Asthma childhood   No albuterol requirement in years, then acute flare 02/2012   ED (erectile dysfunction) 2014   Herpes zoster 11/2010   Morbid obesity (HCC)    Plantar fasciitis    [employer] orthotics helping   Thyroid nodule summer 2019   u/s -->2.1 cm-->bx neg.  Has f/u 03/2018.   Tobacco abuse 12/28/2011   Quit smoking 2019,  then took up dipping a lot.    Past Surgical History:  Procedure Laterality Date   ADENOIDECTOMY  1993   BIOPSY THYROID  08/2017   NEG for malignancy   WISDOM TOOTH EXTRACTION  2009    Outpatient Medications Prior to Visit  Medication Sig Dispense Refill   buPROPion (WELLBUTRIN XL) 150 MG 24 hr tablet Take 1 tablet (150 mg total) by mouth daily. 90 tablet 3   Famotidine-Ca Carb-Mag Hydrox (PEPCID COMPLETE PO) Take by mouth.     Omeprazole Magnesium (PRILOSEC OTC PO) Take by mouth daily.     Methylphenidate HCl ER 54 MG TB24 Take 1 tablet by mouth daily with breakfast. 30 tablet 0   acetaminophen (TYLENOL) 500 MG tablet Take by mouth as needed. (Patient not taking: No sig reported)     mupirocin ointment (BACTROBAN) 2 % Apply 1 application topically 3 (three) times daily. (Patient not taking: No sig reported) 15 g 1   COVID-19 mRNA vaccine, Pfizer, 30 MCG/0.3ML injection INJECT AS DIRECTED (Patient not taking: No sig reported) .3 mL 0   influenza vac split quadrivalent PF (FLUARIX) 0.5 ML injection TO BE ADMINSTERED BY THE PHARMACIST (Patient not taking: No sig reported) .5 mL 0   No facility-administered medications prior to visit.    Allergies  Allergen Reactions   Cephalosporins    Penicillins  ROS As per HPI  PE: Vitals with BMI 08/29/2020 06/01/2020 03/03/2020  Height 5' 9.25" 5' 9.25" 5' 9.25"  Weight 291 lbs 13 oz 326 lbs 6 oz 328 lbs 10 oz  BMI 42.78 47.85 48.17  Systolic 132 124 295  Diastolic 83 78 72  Pulse 93 91 77     Gen: Alert, well appearing.  Patient is oriented to person, place, time, and situation. AFFECT: pleasant, lucid thought and speech. No further exam today.  LABS:    Chemistry      Component Value Date/Time   NA 139 01/21/2020 1555   K 3.8 01/21/2020 1555   CL 105 01/21/2020 1555   CO2 25 01/21/2020 1555   BUN 11 01/21/2020 1555   CREATININE 0.94 01/21/2020 1555      Component Value Date/Time   CALCIUM 9.6 01/21/2020 1555   ALKPHOS  83 04/10/2008 0940   AST 19 01/21/2020 1555   ALT 29 01/21/2020 1555   BILITOT 0.4 01/21/2020 1555     Lab Results  Component Value Date   CHOL 167 10/31/2012   HDL 57.40 10/31/2012   LDLCALC 95 10/31/2012   TRIG 72.0 10/31/2012   CHOLHDL 3 10/31/2012   Lab Results  Component Value Date   HGBA1C 5.5 01/21/2020   IMPRESSION AND PLAN:  1) Adult ADD, GAD, hx of MDD: he's doing well on wellbutrin xl 150mg  qd and concerta 54mg  qd. I did rx for concerta 54mg , 1 qd, #90 today. He'll find a PCP in [place] as quick as he can. No RF for wellbutrin was needed today.  An After Visit Summary was printed and given to the patient.  FOLLOW UP: Return for none--pt moving to .  Signed:  , MD           08/29/2020

## 2020-09-01 ENCOUNTER — Ambulatory Visit: Payer: 59 | Admitting: Family Medicine

## 2020-09-12 ENCOUNTER — Telehealth: Payer: Self-pay | Admitting: Family Medicine

## 2020-09-12 NOTE — Telephone Encounter (Signed)
Patient has moved to [place] and is requesting a letter for work regarding Concerta to include how long he has been on it and verify that he is stable on it. Patient would like letter in [place] preferably or fax to [phone].

## 2020-09-12 NOTE — Telephone Encounter (Signed)
Please review and advise.

## 2020-09-12 NOTE — Telephone Encounter (Signed)
Letter faxed. LM for pt to return call

## 2020-09-12 NOTE — Telephone Encounter (Signed)
[  11:34 AM] [place], Nunzio Banet returning your call. i told him letter was faxed.

## 2020-09-12 NOTE — Telephone Encounter (Signed)
Letter printed, signed, placed on your desk.

## 2021-12-06 ENCOUNTER — Other Ambulatory Visit: Payer: Self-pay | Admitting: Family Medicine

## 2021-12-06 NOTE — Telephone Encounter (Signed)
Requesting:methylphenidate Contract: 12/11/19 UDS: n/a Last Visit: 08/29/20 Next Visit: 01/01/22 Last Refill: 08/29/20 (90,0)  Please Advise. Med pending

## 2021-12-06 NOTE — Telephone Encounter (Signed)
Pt is scheduled for 01/01/22 however he is currently took his last pill for his adhd medication today and is worried about missing dosage. He has been taking it for years and takes as needed. He is asking that enough can be called in until his appointment on the Dec 11. He had an unexpected move back to [place] from [place] which has messed up his medication refill. Please advise patient; he can be reached at [phone]

## 2021-12-06 NOTE — Telephone Encounter (Signed)
Pt advised we cannot refill this medication. He will contact previous provider's office.

## 2021-12-06 NOTE — Telephone Encounter (Signed)
I have to deny this RF b/c I haven't seen him in over a year. I used to rx this med for him but he moved away after I saw him back in August 2022.

## 2022-01-01 ENCOUNTER — Encounter: Payer: Self-pay | Admitting: Family Medicine

## 2022-01-01 ENCOUNTER — Ambulatory Visit: Payer: Commercial Managed Care - HMO | Admitting: Family Medicine

## 2022-01-01 VITALS — BP 127/70 | HR 78 | Temp 97.6°F | Ht 69.25 in | Wt 325.8 lb

## 2022-01-01 DIAGNOSIS — F3342 Major depressive disorder, recurrent, in full remission: Secondary | ICD-10-CM

## 2022-01-01 DIAGNOSIS — F988 Other specified behavioral and emotional disorders with onset usually occurring in childhood and adolescence: Secondary | ICD-10-CM

## 2022-01-01 DIAGNOSIS — F411 Generalized anxiety disorder: Secondary | ICD-10-CM

## 2022-01-01 DIAGNOSIS — J019 Acute sinusitis, unspecified: Secondary | ICD-10-CM

## 2022-01-01 MED ORDER — DOXYCYCLINE HYCLATE 100 MG PO CAPS: 100.0000 mg | ORAL_CAPSULE | Freq: Two times a day (BID) | ORAL | 0 refills | Status: AC

## 2022-01-01 MED ORDER — METHYLPHENIDATE HCL ER 54 MG PO TB24: 1.0000 | ORAL_TABLET | Freq: Every day | ORAL | 0 refills | Status: DC

## 2022-01-01 NOTE — Progress Notes (Signed)
Office Note 01/01/2022  CC: re-establish, ADD  HPI:  Cesar Young is a 36 y.o. White male who is here to reestablish care and follow-up adult ADD and generalized anxiety.  Old records were reviewed prior to or during today's visit.  Has had URI symptoms for the better part of 2 weeks, mostly sinusitis symptoms at this point.  No fever.  No shortness of breath or wheezing.  Since I saw him his Wellbutrin was increased to 300 mg a day in hopes to help him quit vaping as well as control anxiety and mood better. Pt states all is going well with the med at current dosing (methylphenidate ER 54mg ): much improved focus, concentration, task completion.  Less frustration, better multitasking, less impulsivity and restlessness.  Mood is stable. No side effects from the medication.  His weight has gone up 35 pounds in the last 1-1/2 years. PMP AWARE reviewed today: most recent rx for methylphenidate ER 54 mg was filled 12/13/2021, # 30, rx by Blanchie Serve in [place], [place]. No red flags.   Past Medical History:  Diagnosis Date   Adult ADHD    Allergic rhinitis    Anxiety and depression    resp well to wellbutrin   Asthma childhood   No albuterol requirement in years, then acute flare 02/2012   COVID-19 virus infection 07/2020   ED (erectile dysfunction) 2014   Herpes zoster 11/2010   Morbid obesity ([place])    Plantar fasciitis    [employer] orthotics helping   Thyroid nodule summer 2019   u/s -->2.1 cm-->bx neg.  Has f/u 03/2018.   Tobacco abuse 12/28/2011   Quit smoking 2019, then took up dipping a lot.    Past Surgical History:  Procedure Laterality Date   ADENOIDECTOMY  1993   BIOPSY THYROID  08/2017   NEG for malignancy   WISDOM TOOTH EXTRACTION  2009    Family History  Problem Relation Age of Onset   GER disease Mother    Cancer Father        thyroid cancer ("non-familial type"   Depression Father     Social History   Socioeconomic History   Marital status:  Single    Spouse name: Not on file   Number of children: Not on file   Years of education: Not on file   Highest education level: Associate degree: academic program  Occupational History   Not on file  Tobacco Use   Smoking status: Former    Packs/day: 0.25    Years: 9.00    Total pack years: 2.25    Types: Cigarettes    Quit date: 01/04/2018    Years since quitting: 3.9   Smokeless tobacco: Current    Types: Snuff  Vaping Use   Vaping Use: Never used  Substance and Sexual Activity   Alcohol use: Yes    Comment: occasionally 1 or 2 beers every couple months   Drug use: No   Sexual activity: Yes    Partners: Female  Other Topics Concern   Not on file  Social History Narrative   Single.  No children.   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   Currently working for [employer], plans on taking one class at [employer] soon in attempt to finish his associates of Arts degree--ultimately wants to get BA in Marketing at [employer].   Worked for [employer] x 3 yrs.   Smokes 1 pack cigs/day x 10 yrs as  of 11/2017.     Occ alcohol--2-3 beers at a time--denies abuse.  No drug use.   No consistent exercise.  Diet is poor.   Social Determinants of Health   Financial Resource Strain: Low Risk  (12/31/2021)   Overall Financial Resource Strain (CARDIA)    Difficulty of Paying Living Expenses: Not very hard  Food Insecurity: No Food Insecurity (12/31/2021)   Hunger Vital Sign    Worried About Running Out of Food in the Last Year: Never true    Ran Out of Food in the Last Year: Never true  Transportation Needs: No Transportation Needs (12/31/2021)   PRAPARE - Hydrologist (Medical): No    Lack of Transportation (Non-Medical): No  Physical Activity: Sufficiently Active (12/31/2021)   Exercise Vital Sign    Days of Exercise per Week: 4 days    Minutes of Exercise per Session: 60 min  Stress: No Stress Concern Present (12/31/2021)   [place]    Feeling of Stress : Only a little  Social Connections: Moderately Integrated (12/31/2021)   Social Connection and Isolation Panel [NHANES]    Frequency of Communication with Friends and Family: More than three times a week    Frequency of Social Gatherings with Friends and Family: Once a week    Attends Religious Services: 1 to 4 times per year    Active Member of [employer] or Organizations: Yes    Attends Archivist Meetings: 1 to 4 times per year    Marital Status: Never married  Intimate Partner Violence: Not on file    Outpatient Encounter Medications as of 01/01/2022  Medication Sig   acetaminophen (TYLENOL) 500 MG tablet Take by mouth as needed. (Patient not taking: No sig reported)   buPROPion (WELLBUTRIN XL) 300 MG 24 hr tablet Take 300 mg by mouth daily.   Famotidine-Ca Carb-Mag Hydrox (PEPCID COMPLETE PO) Take by mouth.   Methylphenidate HCl ER 54 MG TB24 Take 1 tablet by mouth daily with breakfast.   mupirocin ointment (BACTROBAN) 2 % Apply 1 application topically 3 (three) times daily. (Patient not taking: No sig reported)   Omeprazole Magnesium (PRILOSEC OTC PO) Take by mouth daily.   [DISCONTINUED] buPROPion (WELLBUTRIN XL) 150 MG 24 hr tablet Take 1 tablet (150 mg total) by mouth daily.   No facility-administered encounter medications on file as of 01/01/2022.    Allergies  Allergen Reactions   Cephalosporins    Penicillins     PE; Blood pressure 127/70, pulse 78, temperature 97.6 F (36.4 C), height 5' 9.25" (1.759 m), weight (!) 325 lb 12.8 oz (147.8 kg), SpO2 97 %.Body mass index is 47.77 kg/m.  Physical Exam  VS: noted--normal. Gen: alert, NAD, NONTOXIC APPEARING. HEENT: eyes without injection, drainage, or swelling.  Ears: EACs clear, TMs with normal light reflex and landmarks.  Nose: Clear rhinorrhea, with some dried, crusty exudate adherent to mildly injected mucosa.  No  purulent d/c.  No paranasal sinus TTP.  No facial swelling.  Throat and mouth without focal lesion.  No pharyngial swelling, erythema, or exudate.   Neck: supple, no LAD.   LUNGS: CTA bilat, nonlabored resps.   CV: RRR, no m/r/g. EXT: no c/c/e SKIN: no rash  Pertinent labs:  Last CBC Lab Results  Component Value Date   WBC 6.5 04/10/2008   HGB 15.3 04/10/2008   HCT 45.3 04/10/2008   MCV 90.0 04/10/2008   RDW 12.0 04/10/2008  PLT 167 79/15/0569   Last metabolic panel Lab Results  Component Value Date   GLUCOSE 108 (H) 01/21/2020   NA 139 01/21/2020   K 3.8 01/21/2020   CL 105 01/21/2020   CO2 25 01/21/2020   BUN 11 01/21/2020   CREATININE 0.94 01/21/2020   CALCIUM 9.6 01/21/2020   PROT 7.2 01/21/2020   ALBUMIN 4.1 04/10/2008   BILITOT 0.4 01/21/2020   ALKPHOS 83 04/10/2008   AST 19 01/21/2020   ALT 29 01/21/2020   Last lipids Lab Results  Component Value Date   CHOL 167 10/31/2012   HDL 57.40 10/31/2012   LDLCALC 95 10/31/2012   TRIG 72.0 10/31/2012   CHOLHDL 3 10/31/2012   Last hemoglobin A1c Lab Results  Component Value Date   HGBA1C 5.5 01/21/2020   ASSESSMENT AND PLAN:   New patient, reestablishing care.  1.  Adult ADD. Continue on methylphenidate ER 54 mg daily. Initiate controlled substance contract.  2.  GAD, history of major depressive disorder. Stable on Wellbutrin XL 300 mg a day.  3.  Acute sinusitis. Doxycycline 100 twice daily x 7 days.   An After Visit Summary was printed and given to the patient.  No follow-ups on file.  Signed:  Crissie Sickles, MD           01/01/2022

## 2022-02-05 ENCOUNTER — Encounter: Payer: Self-pay | Admitting: Family Medicine

## 2022-02-05 ENCOUNTER — Ambulatory Visit (INDEPENDENT_AMBULATORY_CARE_PROVIDER_SITE_OTHER): Payer: Self-pay | Admitting: Family Medicine

## 2022-02-05 VITALS — BP 126/74 | HR 92 | Temp 98.2°F | Ht 69.25 in | Wt 338.6 lb

## 2022-02-05 DIAGNOSIS — J329 Chronic sinusitis, unspecified: Secondary | ICD-10-CM

## 2022-02-05 DIAGNOSIS — R051 Acute cough: Secondary | ICD-10-CM

## 2022-02-05 LAB — POC COVID19 BINAXNOW: SARS Coronavirus 2 Ag: NEGATIVE

## 2022-02-05 MED ORDER — CLINDAMYCIN HCL 300 MG PO CAPS: ORAL_CAPSULE | ORAL | 0 refills | Status: DC

## 2022-02-05 MED ORDER — PREDNISONE 20 MG PO TABS: ORAL_TABLET | ORAL | 0 refills | Status: DC

## 2022-02-05 MED ORDER — AZELASTINE HCL 0.1 % NA SOLN: 1.0000 | Freq: Two times a day (BID) | NASAL | 5 refills | Status: DC

## 2022-02-05 NOTE — Progress Notes (Signed)
OFFICE VISIT  02/05/2022  CC:  Chief Complaint  Patient presents with   Congestion    Nasal and chest, mainly in the mornings and at night before bed. Has been using humidifier in his room, this helped some but not as much. Completed doxycycline 1 month ago for URI. C/o headache off and on for the past 1-2 months, last headache was 2-3 nights ago. Used Advil, Tylenol. Denies n/v/d, body aches/chills, or fever    Patient is a 37 y.o. male who presents for sinus discomfort.  HPI: About 10-day history of nasal congestion and postnasal drip which is markedly worse at night and in the morning.  Some coughing but no wheezing or chest tightness.  No fever.  No actual face pain but feels significant sinus pressure. Had similar illness about a month ago but symptoms did not completely clear. He took a course of doxycycline at that time.  +hx of allergic rhinitis, remote hx asthma (childhood)  Past Medical History:  Diagnosis Date   Adult ADHD    Allergic rhinitis    Anxiety and depression    resp well to wellbutrin   Asthma childhood   No albuterol requirement in years, then acute flare 02/2012   COVID-19 virus infection 07/2020   ED (erectile dysfunction) 2014   Herpes zoster 11/2010   Morbid obesity ([place])    Plantar fasciitis    [employer] orthotics helping   Thyroid nodule summer 2019   u/s -->2.1 cm-->bx neg.  Has f/u 03/2018.   Tobacco abuse 12/28/2011   Quit smoking 2019, then took up dipping a lot.    Past Surgical History:  Procedure Laterality Date   ADENOIDECTOMY  1993   BIOPSY THYROID  08/2017   NEG for malignancy   WISDOM TOOTH EXTRACTION  2009    Outpatient Medications Prior to Visit  Medication Sig Dispense Refill   buPROPion (WELLBUTRIN XL) 300 MG 24 hr tablet Take 300 mg by mouth daily.     Famotidine-Ca Carb-Mag Hydrox (PEPCID COMPLETE PO) Take by mouth.     Methylphenidate HCl ER 54 MG TB24 Take 1 tablet by mouth daily with breakfast. 90 tablet 0   Omeprazole  Magnesium (PRILOSEC OTC PO) Take by mouth daily.     tadalafil (CIALIS) 20 MG tablet Take 20 mg by mouth as needed.     No facility-administered medications prior to visit.    Allergies  Allergen Reactions   Cephalosporins    Penicillins     Review of Systems  As per HPI  PE:    02/05/2022    2:30 PM 01/01/2022   12:53 PM 08/29/2020    3:55 PM  Vitals with BMI  Height 5' 9.25" 5' 9.25" 5' 9.25"  Weight 338 lbs 10 oz 325 lbs 13 oz 291 lbs 13 oz  BMI 49.64 67.12 45.80  Systolic 998 338 250  Diastolic 74 70 83  Pulse 92 78 93    Physical Exam  VS: noted--normal. Gen: alert, NAD, NONTOXIC APPEARING. HEENT: eyes without injection, drainage, or swelling.  Ears: EACs clear, TMs with normal light reflex and landmarks.  Nose: Clear rhinorrhea, with some dried, crusty exudate adherent to mildly injected mucosa.  Septum midline.  No significant turbinate hypertrophy.  No purulent d/c.  No paranasal sinus TTP.  No facial swelling.  Throat and mouth without focal lesion.  No pharyngial swelling, erythema, or exudate.   Neck: supple, no LAD.   LUNGS: CTA bilat, nonlabored resps.   CV: RRR, no m/r/g.  EXT: no c/c/e SKIN: no rash   LABS:  Last metabolic panel Lab Results  Component Value Date   GLUCOSE 108 (H) 01/21/2020   NA 139 01/21/2020   K 3.8 01/21/2020   CL 105 01/21/2020   CO2 25 01/21/2020   BUN 11 01/21/2020   CREATININE 0.94 01/21/2020   CALCIUM 9.6 01/21/2020   PROT 7.2 01/21/2020   ALBUMIN 4.1 04/10/2008   BILITOT 0.4 01/21/2020   ALKPHOS 83 04/10/2008   AST 19 01/21/2020   ALT 29 01/21/2020   Lab Results  Component Value Date   HGBA1C 5.5 01/21/2020    IMPRESSION AND PLAN:  Recurrent sinusitis.  Likely combo of infectious plus allergic. Incomplete improvement with doxycycline a month ago. Rapid covid NEG here today.  Plan: Clindamycin 300 mg 3 times daily x 10 days (penicillin intol---severe vomiting as a child, ?cephalosporin allergy). Prednisone  40mg  qd x 5d. Astelin nasal spray q12h rx'd. Sinus radiographs ordered.  An After Visit Summary was printed and given to the patient.  FOLLOW UP: Return if symptoms worsen or fail to improve.  Signed:  Crissie Sickles, MD           02/05/2022

## 2022-02-06 ENCOUNTER — Telehealth (HOSPITAL_BASED_OUTPATIENT_CLINIC_OR_DEPARTMENT_OTHER): Payer: Self-pay

## 2022-02-13 ENCOUNTER — Other Ambulatory Visit: Payer: Self-pay

## 2022-02-13 ENCOUNTER — Ambulatory Visit
Admission: EM | Admit: 2022-02-13 | Discharge: 2022-02-13 | Disposition: A | Discharge status: Home / Self Care | Payer: Self-pay | Attending: Urgent Care | Admitting: Urgent Care

## 2022-02-13 ENCOUNTER — Encounter (HOSPITAL_BASED_OUTPATIENT_CLINIC_OR_DEPARTMENT_OTHER): Payer: Self-pay

## 2022-02-13 ENCOUNTER — Other Ambulatory Visit (HOSPITAL_BASED_OUTPATIENT_CLINIC_OR_DEPARTMENT_OTHER): Payer: Self-pay

## 2022-02-13 ENCOUNTER — Emergency Department (HOSPITAL_BASED_OUTPATIENT_CLINIC_OR_DEPARTMENT_OTHER)
Admission: EM | Admit: 2022-02-13 | Discharge: 2022-02-13 | Disposition: A | Discharge status: Home / Self Care | Payer: Commercial Managed Care - HMO | Attending: Emergency Medicine | Admitting: Emergency Medicine

## 2022-02-13 DIAGNOSIS — Z1152 Encounter for screening for COVID-19: Secondary | ICD-10-CM | POA: Diagnosis not present

## 2022-02-13 DIAGNOSIS — K529 Noninfective gastroenteritis and colitis, unspecified: Secondary | ICD-10-CM | POA: Diagnosis not present

## 2022-02-13 DIAGNOSIS — J45909 Unspecified asthma, uncomplicated: Secondary | ICD-10-CM | POA: Insufficient documentation

## 2022-02-13 DIAGNOSIS — R112 Nausea with vomiting, unspecified: Secondary | ICD-10-CM

## 2022-02-13 DIAGNOSIS — R197 Diarrhea, unspecified: Secondary | ICD-10-CM

## 2022-02-13 LAB — COMPREHENSIVE METABOLIC PANEL
ALT: 48 U/L — ABNORMAL HIGH (ref 0–44)
AST: 35 U/L (ref 15–41)
Albumin: 4.1 g/dL (ref 3.5–5.0)
Alkaline Phosphatase: 65 U/L (ref 38–126)
Anion gap: 13 (ref 5–15)
BUN: 18 mg/dL (ref 6–20)
CO2: 20 mmol/L — ABNORMAL LOW (ref 22–32)
Calcium: 8.7 mg/dL — ABNORMAL LOW (ref 8.9–10.3)
Chloride: 99 mmol/L (ref 98–111)
Creatinine, Ser: 0.92 mg/dL (ref 0.61–1.24)
GFR, Estimated: >60 mL/min (ref >60)
Glucose, Bld: 128 mg/dL — ABNORMAL HIGH (ref 70–99)
Potassium: 3.6 mmol/L (ref 3.5–5.1)
Sodium: 132 mmol/L — ABNORMAL LOW (ref 135–145)
Total Bilirubin: 0.9 mg/dL (ref 0.3–1.2)
Total Protein: 8 g/dL (ref 6.5–8.1)

## 2022-02-13 LAB — URINALYSIS, ROUTINE W REFLEX MICROSCOPIC
Bilirubin Urine: NEGATIVE
Glucose, UA: NEGATIVE mg/dL
Hgb urine dipstick: NEGATIVE
Ketones, ur: NEGATIVE mg/dL
Leukocytes,Ua: NEGATIVE
Nitrite: NEGATIVE
Protein, ur: NEGATIVE mg/dL
Specific Gravity, Urine: >=1.03 (ref 1.005–1.030)
pH: 5.5 (ref 5.0–8.0)

## 2022-02-13 LAB — CBC
HCT: 49.6 % (ref 39.0–52.0)
Hemoglobin: 16.8 g/dL (ref 13.0–17.0)
MCH: 29.3 pg (ref 26.0–34.0)
MCHC: 33.9 g/dL (ref 30.0–36.0)
MCV: 86.6 fL (ref 80.0–100.0)
Platelets: 246 10*3/uL (ref 150–400)
RBC: 5.73 MIL/uL (ref 4.22–5.81)
RDW: 13.2 % (ref 11.5–15.5)
WBC: 10.9 10*3/uL — ABNORMAL HIGH (ref 4.0–10.5)
nRBC: 0 % (ref 0.0–0.2)

## 2022-02-13 LAB — RESP PANEL BY RT-PCR (RSV, FLU A&B, COVID)  RVPGX2
Influenza A by PCR: NEGATIVE
Influenza B by PCR: NEGATIVE
Resp Syncytial Virus by PCR: NEGATIVE
SARS Coronavirus 2 by RT PCR: NEGATIVE

## 2022-02-13 LAB — LIPASE, BLOOD: Lipase: 27 U/L (ref 11–51)

## 2022-02-13 LAB — POCT INFLUENZA A/B
Influenza A, POC: NEGATIVE
Influenza B, POC: NEGATIVE

## 2022-02-13 MED ORDER — SODIUM CHLORIDE 0.9 % IV BOLUS
1000.0000 mL | Freq: Once | INTRAVENOUS | Status: AC
  Administered 2022-02-13: 1000 mL via INTRAVENOUS

## 2022-02-13 MED ORDER — ONDANSETRON HCL 4 MG PO TABS
4.0000 mg | ORAL_TABLET | Freq: Four times a day (QID) | ORAL | 0 refills | Status: DC
  Filled 2022-02-13: qty 12, 3d supply, fill #0

## 2022-02-13 MED ORDER — ONDANSETRON 8 MG PO TBDP
8.0000 mg | ORAL_TABLET | Freq: Once | ORAL | Status: AC
  Administered 2022-02-13: 8 mg via ORAL

## 2022-02-13 MED ORDER — ONDANSETRON 4 MG PO TBDP: 4.0000 mg | ORAL_TABLET | Freq: Once | ORAL | Status: DC

## 2022-02-13 NOTE — ED Provider Notes (Signed)
[facility] Provider Note   CSN: [id number] Arrival date & time: 02/13/22  1305     History  Chief Complaint  Patient presents with   Vomiting   Diarrhea    Cesar Young is a 37 y.o. male.  Patient here with nausea vomiting diarrhea since this morning.  Possibly suspicious food intake with fish sticks that might of been too old to eat.  Denies any abdominal pain.  He had multiple episodes of emesis this morning but now resolved.  Denies any weakness or numbness.  Has had some diarrhea.  He just finished some antibiotics for sinus infection.  Denies any chest pain shortness of breath.  No history of abdominal surgery.  History of asthma.  The history is provided by the patient.       Home Medications Prior to Admission medications   Medication Sig Start Date End Date Taking? Authorizing Provider  ondansetron (ZOFRAN) 4 MG tablet Take 1 tablet (4 mg total) by mouth every 6 (six) hours. 02/13/22  Yes Jamiya Nims, DO  azelastine (ASTELIN) 0.1 % nasal spray Place 1 spray into both nostrils 2 (two) times daily. Use in each nostril as directed 02/05/22   McGowen, Adrian Blackwater, MD  buPROPion (WELLBUTRIN XL) 300 MG 24 hr tablet Take 300 mg by mouth daily.    [provider]  clindamycin (CLEOCIN) 300 MG capsule 1 cap po tid x 10d 02/05/22   McGowen, Adrian Blackwater, MD  Famotidine-Ca Carb-Mag Hydrox (PEPCID COMPLETE PO) Take by mouth.    [provider]  Methylphenidate HCl ER 54 MG TB24 Take 1 tablet by mouth daily with breakfast. 01/01/22   McGowen, Adrian Blackwater, MD  Omeprazole Magnesium (PRILOSEC OTC PO) Take by mouth daily.    [provider]  tadalafil (CIALIS) 20 MG tablet Take 20 mg by mouth as needed. 01/27/22   [provider]      Allergies    Cephalosporins and Penicillins    Review of Systems   Review of Systems  Physical Exam Updated Vital Signs BP 130/88   Pulse (!) 101   Temp 99.6 F (37.6 C)  (Oral)   Resp 20   Ht 5\' 10"  (1.778 m)   Wt (!) 154.2 kg   SpO2 95%   BMI 48.78 kg/m  Physical Exam Vitals and nursing note reviewed.  Constitutional:      General: He is not in acute distress.    Appearance: He is well-developed. He is not ill-appearing.  HENT:     Head: Normocephalic and atraumatic.     Nose: Nose normal.     Mouth/Throat:     Mouth: Mucous membranes are moist.  Eyes:     Extraocular Movements: Extraocular movements intact.     Conjunctiva/sclera: Conjunctivae normal.     Pupils: Pupils are equal, round, and reactive to light.  Cardiovascular:     Rate and Rhythm: Normal rate and regular rhythm.     Pulses: Normal pulses.     Heart sounds: Normal heart sounds. No murmur heard. Pulmonary:     Effort: Pulmonary effort is normal. No respiratory distress.     Breath sounds: Normal breath sounds.  Abdominal:     Palpations: Abdomen is soft.     Tenderness: There is no abdominal tenderness.  Musculoskeletal:        General: No swelling.     Cervical back: Normal range of motion and neck supple.  Skin:  General: Skin is warm and dry.     Capillary Refill: Capillary refill takes less than 2 seconds.  Neurological:     Mental Status: He is alert.  Psychiatric:        Mood and Affect: Mood normal.     ED Results / Procedures / Treatments   Labs (all labs ordered are listed, but only abnormal results are displayed) Labs Reviewed  COMPREHENSIVE METABOLIC PANEL - Abnormal; Notable for the following components:      Result Value   Sodium 132 (*)    CO2 20 (*)    Glucose, Bld 128 (*)    Calcium 8.7 (*)    ALT 48 (*)    All other components within normal limits  CBC - Abnormal; Notable for the following components:   WBC 10.9 (*)    All other components within normal limits  RESP PANEL BY RT-PCR (RSV, FLU A&B, COVID)  RVPGX2  C DIFFICILE QUICK SCREEN W PCR REFLEX    GASTROINTESTINAL PANEL BY PCR, STOOL (REPLACES STOOL CULTURE)  LIPASE, BLOOD   URINALYSIS, ROUTINE W REFLEX MICROSCOPIC    EKG None  Radiology No results found.  Procedures Procedures    Medications Ordered in ED Medications  sodium chloride 0.9 % bolus 1,000 mL (1,000 mLs Intravenous New Bag/Given 02/13/22 1558)    ED Course/ Medical Decision Making/ A&P                             Medical Decision Making Amount and/or Complexity of Data Reviewed Labs: ordered.  Risk Prescription drug management.   Cesar Young is here with nausea and diarrhea.  Patient is overall unremarkable vitals except for mild tachycardia.  He appears very well.  He states that nausea and vomiting has greatly improved since getting Zofran at urgent care.  He is not tender in his abdomen.  He wants to try to eat or drink something.  Differential diagnosis likely viral process versus foodborne illness.  He did recently finish a course of antibiotics and could be C. difficile.  Will see if we can get a stool sample to evaluate for C. difficile and GI stool pathogen panel.  Overall he is already had blood work done including a CBC, CMP lipase and per my review and interpretation of labs there is no significant anemia or electrolyte abnormality or kidney injury.  Patient negative for COVID and flu.  No urinary tract infection.  Will give him IV fluids and anticipate discharge with prescription for Zofran.  Have no concern for appendicitis or other acute process at this time.  Feeling much better after IV fluids.  Tachycardia has resolved.  Discharged in good condition.  Recommend that he follow-up with his primary care doctor to send off stool studies if he is having persistent symptoms.  Will prescribe Zofran.  This chart was dictated using voice recognition software.  Despite best efforts to proofread,  errors can occur which can change the documentation meaning.         Final Clinical Impression(s) / ED Diagnoses Final diagnoses:  Gastroenteritis    Rx / DC Orders ED  Discharge Orders          Ordered    ondansetron (ZOFRAN) 4 MG tablet  Every 6 hours        02/13/22 [place], [place], DO 02/13/22 1659

## 2022-02-13 NOTE — ED Notes (Signed)
800 cc of bile color emesis - unable to get BP due to pt vomiting- provider updated

## 2022-02-13 NOTE — ED Provider Notes (Signed)
[facility]    CSN: [id number] Arrival date & time: 02/13/22  1135      History   Chief Complaint Chief Complaint  Patient presents with   Emesis   Diarrhea    HPI Cesar Young is a 37 y.o. male.   37 year old male presents today due to acute onset of diarrhea and vomiting starting abruptly at 430 this morning.  States when he went to bed last night, he felt at his baseline health.  Since awakening at 430, he has had 6-8 episodes of vomiting, and several episodes of diarrhea.  States he has a hot cold chills, but denies fever.  Does have a slight headache and dizziness.  Patient denies any abdominal pain.  He has not been able to eat anything this morning, is currently trying to drink Gatorade.  Denies any known sick contacts.  Patient is uncertain if this is secondary to fish that he cooked last night in the [place], but states that his mom ate the same thing and she is not sick.   Emesis Associated symptoms: diarrhea   Diarrhea Associated symptoms: vomiting     Past Medical History:  Diagnosis Date   Adult ADHD    Allergic rhinitis    Anxiety and depression    resp well to wellbutrin   Asthma childhood   No albuterol requirement in years, then acute flare 02/2012   COVID-19 virus infection 07/2020   ED (erectile dysfunction) 2014   Herpes zoster 11/2010   Morbid obesity ([place])    Plantar fasciitis    [employer] orthotics helping   Thyroid nodule summer 2019   u/s -->2.1 cm-->bx neg.  Has f/u 03/2018.   Tobacco abuse 12/28/2011   Quit smoking 2019, then took up dipping a lot.    Patient Active Problem List   Diagnosis Date Noted   Patellofemoral pain syndrome of right knee 06/12/2018   Acute bronchitis 10/29/2012   Viral syndrome 10/29/2012   Health maintenance examination 10/23/2012   Erectile dysfunction 10/23/2012   Tobacco dependence 10/23/2012    Past Surgical History:  Procedure Laterality Date   ADENOIDECTOMY  1993   BIOPSY THYROID   08/2017   NEG for malignancy   WISDOM TOOTH EXTRACTION  2009       Home Medications    Prior to Admission medications   Medication Sig Start Date End Date Taking? Authorizing Provider  azelastine (ASTELIN) 0.1 % nasal spray Place 1 spray into both nostrils 2 (two) times daily. Use in each nostril as directed 02/05/22   McGowen, Adrian Blackwater, MD  buPROPion (WELLBUTRIN XL) 300 MG 24 hr tablet Take 300 mg by mouth daily.    [provider]  clindamycin (CLEOCIN) 300 MG capsule 1 cap po tid x 10d 02/05/22   McGowen, Adrian Blackwater, MD  Famotidine-Ca Carb-Mag Hydrox (PEPCID COMPLETE PO) Take by mouth.    [provider]  Methylphenidate HCl ER 54 MG TB24 Take 1 tablet by mouth daily with breakfast. 01/01/22   McGowen, Adrian Blackwater, MD  Omeprazole Magnesium (PRILOSEC OTC PO) Take by mouth daily.    [provider]  tadalafil (CIALIS) 20 MG tablet Take 20 mg by mouth as needed. 01/27/22   [provider]    Family History Family History  Problem Relation Age of Onset   GER disease Mother    Cancer Father        thyroid cancer ("non-familial type"   Depression Father     Social History  Social History   Tobacco Use   Smoking status: Former    Packs/day: 0.25    Years: 9.00    Total pack years: 2.25    Types: Cigarettes    Quit date: 01/04/2018    Years since quitting: 4.1   Smokeless tobacco: Current    Types: Snuff  Vaping Use   Vaping Use: Never used  Substance Use Topics   Alcohol use: Yes    Comment: occasionally 1 or 2 beers every couple months   Drug use: No     Allergies   Cephalosporins and Penicillins   Review of Systems Review of Systems  Gastrointestinal:  Positive for diarrhea and vomiting.     Physical Exam Triage Vital Signs ED Triage Vitals  Enc Vitals Group     BP --      Pulse Rate 02/13/22 1151 (!) 121     Resp 02/13/22 1151 19     Temp 02/13/22 1151 98.2 F (36.8 C)     Temp Source 02/13/22 1151 Oral     SpO2 02/13/22  1151 96 %     Weight --      Height --      Head Circumference --      Peak Flow --      Pain Score 02/13/22 1155 0     Pain Loc --      Pain Edu? --      Excl. in GC? --    No data found.  Updated Vital Signs Pulse (!) 121   Temp 98.2 F (36.8 C) (Oral)   Resp 19   SpO2 96%   Visual Acuity Right Eye Distance:   Left Eye Distance:   Bilateral Distance:    Right Eye Near:   Left Eye Near:    Bilateral Near:     Physical Exam Vitals and nursing note reviewed. Exam conducted with a chaperone present.  Constitutional:      Appearance: He is obese. He is ill-appearing and toxic-appearing. He is not diaphoretic.     Comments: Pt laying on L side on table, curled in ball, eyes closed  HENT:     Head: Normocephalic and atraumatic.  Cardiovascular:     Rate and Rhythm: Tachycardia present.  Pulmonary:     Effort: Pulmonary effort is normal. No respiratory distress.  Abdominal:     General: Bowel sounds are increased.     Palpations: Abdomen is soft.     Tenderness: There is no abdominal tenderness.      UC Treatments / Results  Labs (all labs ordered are listed, but only abnormal results are displayed) Labs Reviewed  POCT INFLUENZA A/B    EKG   Radiology No results found.  Procedures Procedures (including critical care time)  Medications Ordered in UC Medications  ondansetron (ZOFRAN-ODT) disintegrating tablet 8 mg (8 mg Oral Given 02/13/22 1210)    Initial Impression / Assessment and Plan / UC Course  I have reviewed the triage vital signs and the nursing notes.  Pertinent labs & imaging results that were available during my care of the patient were reviewed by me and considered in my medical decision making (see chart for details).     N/V/D -30 minutes after administration of 8 mg Zofran, patient had large volume emesis of 1000 cc.  Patient continues to be tachycardic and dizzy.  I do have clinical concern for dehydration and electrolyte  abnormality.  Given his failure to respond to antiemetics, ED disposition recommended.  Final Clinical Impressions(s) / UC Diagnoses   Final diagnoses:  Nausea vomiting and diarrhea     Discharge Instructions      Your flu test is negative. I'm concerned you may have sapovirus, which is an intestinal infection. Because you continue to have large volume emesis despite antiemetic therapy in combination with tachycaria, I am concerned about electrolyte imbalance and dehydration. You will need to be seen in the ER for further treatment and evaluation. You need labs and IV hydration. Please head to the ER for further management.     ED Prescriptions   None    PDMP not reviewed this encounter.   Chaney Malling, [place] 02/13/22 1302

## 2022-02-13 NOTE — ED Notes (Signed)
Told pt about need for stool sample, unable to go at this time; requesting something to drink, ok per MD

## 2022-02-13 NOTE — ED Notes (Signed)
Patient is being discharged from the Urgent Care and sent to the Emergency Department via POV w/ family member . Per Derenda Fennel, PA , patient is in need of higher level of care due to tachycardia and intractable vomiting. Patient is aware and verbalizes understanding of plan of care.  Vitals:   02/13/22 1151  Pulse: (!) 121  Resp: 19  Temp: 98.2 F (36.8 C)  SpO2: 96%   Attempt to call [facility] to report

## 2022-02-13 NOTE — ED Triage Notes (Signed)
Pt c/o vomiting and diarrhea that started about 430 this morning. Some chills. About 6-8 episodes of vomiting. 2 dramamine at 830am.

## 2022-02-13 NOTE — Discharge Instructions (Signed)
Your flu test is negative. I'm concerned you may have sapovirus, which is an intestinal infection. Because you continue to have large volume emesis despite antiemetic therapy in combination with tachycaria, I am concerned about electrolyte imbalance and dehydration. You will need to be seen in the ER for further treatment and evaluation. You need labs and IV hydration. Please head to the ER for further management.

## 2022-02-13 NOTE — ED Triage Notes (Addendum)
C/o vomiting/diarrhea since 0430 this morning. Given zofran by UC. Negative for flu. On day 5 of antibiotics for sinus infection.

## 2022-02-13 NOTE — Discharge Instructions (Signed)
Take Zofran as needed for nausea and vomiting.  If you are having persistent diarrhea consider following up with your primary care doctor to send off a stool study.

## 2022-02-14 ENCOUNTER — Telehealth: Payer: Self-pay | Admitting: Emergency Medicine

## 2022-02-14 NOTE — Telephone Encounter (Signed)
Call to Cesar Young to see how he was today - states he is better after getting the IV fluids in the  ED. Nausea resolved after zofran given at [facility] yesterday. Pt to follow up with PCP if he continues to have diarrhea to rule out c-diff as directed by ED provider. Pt verbalized an understanding that in the future he will eat activia yogurt or greek yogurt or  take a probiotic when he is on antibiotics. No other questions or concerns about the visit yesterday. Pt thanked Therapist, sports for the call.

## 2022-03-12 ENCOUNTER — Telehealth: Payer: Self-pay

## 2022-03-12 NOTE — Telephone Encounter (Signed)
Requesting: methylphenidate Contract: n/a UDS:  n/a Last Visit: 02/05/22 Next Visit: 04/02/22 Last Refill: 01/01/22 (90,0)  Please Advise. Med pending. Pt was unable to refill last refill. Will confirm with pharmacy

## 2022-03-12 NOTE — Telephone Encounter (Signed)
Patient refill request.  He is request 1 or 2 pills until appt on 2/21 with Dr. Anitra Lauth.  [facility] [address]

## 2022-03-12 NOTE — Telephone Encounter (Signed)
Confirmed with pharmacy, 1 refill remaining but medication is out of stock. Pt advised to contact pharmacy tomorrow to see if medication is in stock.

## 2022-03-14 ENCOUNTER — Ambulatory Visit: Payer: Commercial Managed Care - HMO | Admitting: Family Medicine

## 2022-04-02 ENCOUNTER — Ambulatory Visit (INDEPENDENT_AMBULATORY_CARE_PROVIDER_SITE_OTHER): Payer: 59 | Admitting: Family Medicine

## 2022-04-02 ENCOUNTER — Encounter: Payer: Self-pay | Admitting: Family Medicine

## 2022-04-02 ENCOUNTER — Ambulatory Visit: Payer: Commercial Managed Care - HMO | Admitting: Family Medicine

## 2022-04-02 VITALS — BP 144/84 | HR 81 | Temp 98.2°F | Ht 70.0 in | Wt 346.2 lb

## 2022-04-02 DIAGNOSIS — R030 Elevated blood-pressure reading, without diagnosis of hypertension: Secondary | ICD-10-CM | POA: Diagnosis not present

## 2022-04-02 DIAGNOSIS — F411 Generalized anxiety disorder: Secondary | ICD-10-CM | POA: Diagnosis not present

## 2022-04-02 DIAGNOSIS — Z79899 Other long term (current) drug therapy: Secondary | ICD-10-CM | POA: Diagnosis not present

## 2022-04-02 DIAGNOSIS — F988 Other specified behavioral and emotional disorders with onset usually occurring in childhood and adolescence: Secondary | ICD-10-CM | POA: Diagnosis not present

## 2022-04-02 MED ORDER — BUPROPION HCL ER (XL) 300 MG PO TB24: 300.0000 mg | ORAL_TABLET | Freq: Every day | ORAL | 1 refills | Status: DC

## 2022-04-02 NOTE — Progress Notes (Addendum)
OFFICE VISIT  04/02/2022  CC: f/u ADD, GAD,mdd  Patient is a 37 y.o. male who presents for 2-monthfollow-up adult ADD, GAD, and history of MDD. A/P as of last visit: "1.  Adult ADD. Continue on methylphenidate ER 54 mg daily. Initiate controlled substance contract.   2.  GAD, history of major depressive disorder. Stable on Wellbutrin XL 300 mg a day.   3.  Acute sinusitis. Doxycycline 100 twice daily x 7 days."  INTERIM HX: MArbinis doing well.  Pt states all is going well with the med at current dosing (methylphenidate ER '54mg'$  qd): much improved focus, concentration, task completion.  Less frustration, better multitasking, less impulsivity and restlessness.  Mood is stable. No side effects from the medication. Anxiety level is stable.  He is very happy with his response to Wellbutrin.   PMP AWARE reviewed today: most recent rx for methylphenidate ER 54 mg was filled 03/13/2022.   Past Medical History:  Diagnosis Date   Adult ADHD    Allergic rhinitis    Anxiety and depression    resp well to wellbutrin   Asthma childhood   No albuterol requirement in years, then acute flare 02/2012   COVID-19 virus infection 07/2020   ED (erectile dysfunction) 2014   Herpes zoster 11/2010   Morbid obesity ([place]    Plantar fasciitis    [employer] orthotics helping   Thyroid nodule summer 2019   u/s -->2.1 cm-->bx neg.  Has f/u 03/2018.   Tobacco abuse 12/28/2011   Quit smoking 2019, then took up dipping a lot.    Past Surgical History:  Procedure Laterality Date   ADENOIDECTOMY  1993   BIOPSY THYROID  08/2017   NEG for malignancy   WISDOM TOOTH EXTRACTION  2009    Outpatient Medications Prior to Visit  Medication Sig Dispense Refill   Famotidine-Ca Carb-Mag Hydrox (PEPCID COMPLETE PO) Take by mouth.     Methylphenidate HCl ER 54 MG TB24 Take 1 tablet by mouth daily with breakfast. 90 tablet 0   Omeprazole Magnesium (PRILOSEC OTC PO) Take by mouth daily.     tadalafil  (CIALIS) 20 MG tablet Take 20 mg by mouth as needed.     buPROPion (WELLBUTRIN XL) 300 MG 24 hr tablet Take 300 mg by mouth daily.     azelastine (ASTELIN) 0.1 % nasal spray Place 1 spray into both nostrils 2 (two) times daily. Use in each nostril as directed (Patient not taking: Reported on 04/02/2022) 30 mL 5   ondansetron (ZOFRAN) 4 MG tablet Take 1 tablet (4 mg total) by mouth every 6 (six) hours. (Patient not taking: Reported on 04/02/2022) 12 tablet 0   clindamycin (CLEOCIN) 300 MG capsule 1 cap po tid x 10d (Patient not taking: Reported on 04/02/2022) 30 capsule 0   No facility-administered medications prior to visit.    Allergies  Allergen Reactions   Cephalosporins    Penicillins     Review of Systems As per HPI  PE:    04/02/2022    1:02 PM 02/13/2022    5:00 PM 02/13/2022    4:45 PM  Vitals with BMI  Height '5\' 10"'$     Weight 346 lbs 3 oz    BMI [id number]   Systolic [id number]  Diastolic 84 88   Pulse 81 105 106  Bp recheck 138/80  Physical Exam  Gen: Alert, well appearing.  Patient is oriented to person, place, time, and situation. AFFECT: pleasant, lucid thought and speech. CV:  RRR, no m/r/g.   LUNGS: CTA bilat, nonlabored resps, good aeration in all lung fields.   LABS:  Last metabolic panel Lab Results  Component Value Date   GLUCOSE 128 (H) 02/13/2022   NA 132 (L) 02/13/2022   K 3.6 02/13/2022   CL 99 02/13/2022   CO2 20 (L) 02/13/2022   BUN 18 02/13/2022   CREATININE 0.92 02/13/2022   GFRNONAA >60 02/13/2022   CALCIUM 8.7 (L) 02/13/2022   PROT 8.0 02/13/2022   ALBUMIN 4.1 02/13/2022   BILITOT 0.9 02/13/2022   ALKPHOS 65 02/13/2022   AST 35 02/13/2022   ALT 48 (H) 02/13/2022   ANIONGAP 13 02/13/2022   IMPRESSION AND PLAN:  #1 adult ADD, GAD, history of major depressive disorder. Doing well overall on current medication regimen. Continue Wellbutrin XL 300 mg daily, methylphenidate ER 54 mg daily (methylphenidate prescription not needed today.  He  will call to request prescription--this can be filled around 3/20). Controlled substance contract was done today. Urine drug screen today.  #2 elevated blood pressure without diagnosis of hypertension. Discussed this today. He says he really needs to get on the ball with lifestyle changes. No meds at this time but I encouraged him to check his blood pressure at least every other day for the next 10 days and send me these numbers by MyChart message.  An After Visit Summary was printed and given to the patient.  FOLLOW UP: Return in about 3 months (around 07/03/2022) for routine chronic illness f/u.  Signed:  Crissie Sickles, MD           04/02/2022

## 2022-04-04 LAB — DRUG MONITORING PANEL 376104, URINE
Amphetamines: NEGATIVE ng/mL (ref <500)
Barbiturates: NEGATIVE ng/mL (ref <300)
Benzodiazepines: NEGATIVE ng/mL (ref <100)
Cocaine Metabolite: NEGATIVE ng/mL (ref <150)
Desmethyltramadol: NEGATIVE ng/mL (ref <100)
Opiates: NEGATIVE ng/mL (ref <100)
Oxycodone: NEGATIVE ng/mL (ref <100)
Tramadol: NEGATIVE ng/mL (ref <100)

## 2022-04-04 LAB — DM TEMPLATE

## 2022-04-12 ENCOUNTER — Other Ambulatory Visit: Payer: Self-pay

## 2022-04-12 MED ORDER — METHYLPHENIDATE HCL ER 54 MG PO TB24: 1.0000 | ORAL_TABLET | Freq: Every day | ORAL | 0 refills | Status: DC

## 2022-04-12 NOTE — Telephone Encounter (Signed)
Med pending. Please Advise

## 2022-04-12 NOTE — Telephone Encounter (Signed)
Patient was seen last week by Dr. Anitra Lauth for med refill, signed contract but medication was never sent to pharmacy.  Patient can be reached at [phone].  [facility] [place]  Methylphenidate HCl ER 54 MG TB24

## 2022-06-28 NOTE — Patient Instructions (Signed)

## 2022-07-02 ENCOUNTER — Encounter: Payer: Self-pay | Admitting: Family Medicine

## 2022-07-02 ENCOUNTER — Ambulatory Visit (INDEPENDENT_AMBULATORY_CARE_PROVIDER_SITE_OTHER): Payer: 59 | Admitting: Family Medicine

## 2022-07-02 VITALS — BP 140/80 | HR 82 | Wt 340.8 lb

## 2022-07-02 DIAGNOSIS — I10 Essential (primary) hypertension: Secondary | ICD-10-CM | POA: Diagnosis not present

## 2022-07-02 DIAGNOSIS — F411 Generalized anxiety disorder: Secondary | ICD-10-CM | POA: Diagnosis not present

## 2022-07-02 DIAGNOSIS — F3342 Major depressive disorder, recurrent, in full remission: Secondary | ICD-10-CM | POA: Diagnosis not present

## 2022-07-02 DIAGNOSIS — G2581 Restless legs syndrome: Secondary | ICD-10-CM

## 2022-07-02 DIAGNOSIS — F988 Other specified behavioral and emotional disorders with onset usually occurring in childhood and adolescence: Secondary | ICD-10-CM | POA: Diagnosis not present

## 2022-07-02 MED ORDER — METHYLPHENIDATE HCL ER 54 MG PO TB24: 1.0000 | ORAL_TABLET | Freq: Every day | ORAL | 0 refills | Status: DC

## 2022-07-02 NOTE — Progress Notes (Signed)
OFFICE VISIT  07/02/2022  CC:  Chief Complaint  Patient presents with   Follow-up    3 month follow up. He has been having trouble sleeping due to restless leg syndrome.    Patient is a 37 y.o. male who presents for 73-month follow-up adult ADD. A/P as of last visit: "#1 adult ADD, GAD, history of major depressive disorder. Doing well overall on current medication regimen. Continue Wellbutrin XL 300 mg daily, methylphenidate ER 54 mg daily (methylphenidate prescription not needed today.  He will call to request prescription--this can be filled around 3/20). Controlled substance contract was done today. Urine drug screen today.   #2 elevated blood pressure without diagnosis of hypertension. Discussed this today. He says he really needs to get on the ball with lifestyle changes. No meds at this time but I encouraged him to check his blood pressure at least every other day for the next 10 days and send me these numbers by MyChart message."  INTERIM HX: Fidel feels well. Mood and anxiety levels stable. He has not established good consistency in healthy lifestyle habits as of yet but has plans to do so.   Pt states all is going well with the med at current dosing (methylphenidate ER 54 mg): much improved focus, concentration, task completion.  Less frustration, better multitasking, less impulsivity and restlessness.  Mood is stable. No side effects from the medication.  He has history of chronic restless leg syndrome that waxes and wanes in intensity without clear pattern.  Lately it has been worse. He admits he drinks too much caffeine and he vapes and feels like this does play a role. He has never been on medication for this.  It impairs his sleep significantly but if he does some stretching of the lower legs and takes a hot Epsom salt bath it seems to completely resolved.  PMP AWARE reviewed today: most recent rx for methylphenidate ER 54mg  was filled 04/12/22, # 90, rx by me. No  red flags.  ROS as above, plus--> no fevers, no CP, no SOB, no wheezing, no cough, no dizziness, no HAs, no rashes, no melena/hematochezia.  No polyuria or polydipsia.  No myalgias or arthralgias.  No focal weakness, paresthesias, or tremors.  No acute vision or hearing abnormalities.  No dysuria or unusual/new urinary urgency or frequency.  No recent changes in lower legs. No n/v/d or abd pain.  No palpitations.    Past Medical History:  Diagnosis Date   Adult ADHD    Allergic rhinitis    Anxiety and depression    resp well to wellbutrin   Asthma childhood   No albuterol requirement in years, then acute flare 02/2012   COVID-19 virus infection 07/2020   ED (erectile dysfunction) 2014   Herpes zoster 11/2010   Morbid obesity (HCC)    Plantar fasciitis    [employer] orthotics helping   Thyroid nodule summer 2019   u/s -->2.1 cm-->bx neg.  Has f/u 03/2018.   Tobacco abuse 12/28/2011   Quit smoking 2019, then took up dipping a lot.    Past Surgical History:  Procedure Laterality Date   ADENOIDECTOMY  1993   BIOPSY THYROID  08/2017   NEG for malignancy   WISDOM TOOTH EXTRACTION  2009    Outpatient Medications Prior to Visit  Medication Sig Dispense Refill   buPROPion (WELLBUTRIN XL) 300 MG 24 hr tablet Take 1 tablet (300 mg total) by mouth daily. 90 tablet 1   Famotidine-Ca Carb-Mag Hydrox (PEPCID COMPLETE PO)  Take by mouth.     Omeprazole Magnesium (PRILOSEC OTC PO) Take by mouth daily.     tadalafil (CIALIS) 20 MG tablet Take 20 mg by mouth as needed.     Methylphenidate HCl ER 54 MG TB24 Take 1 tablet by mouth daily with breakfast. 90 tablet 0   azelastine (ASTELIN) 0.1 % nasal spray Place 1 spray into both nostrils 2 (two) times daily. Use in each nostril as directed (Patient not taking: Reported on 04/02/2022) 30 mL 5   ondansetron (ZOFRAN) 4 MG tablet Take 1 tablet (4 mg total) by mouth every 6 (six) hours. (Patient not taking: Reported on 04/02/2022) 12 tablet 0   No  facility-administered medications prior to visit.    Allergies  Allergen Reactions   Cephalosporins    Penicillins     Review of Systems As per HPI  PE:    07/02/2022    1:25 PM 07/02/2022    1:04 PM 04/02/2022    1:02 PM  Vitals with BMI  Height   5\' 10"   Weight  340 lbs 13 oz 346 lbs 3 oz  BMI   49.67  Systolic 140 150 161  Diastolic 80 88 84  Pulse  82 81     Physical Exam  Wt Readings from Last 2 Encounters:  07/02/22 (!) 340 lb 12.8 oz (154.6 kg)  04/02/22 (!) 346 lb 3.2 oz (157 kg)    Gen: alert, oriented x 4, affect pleasant.  Lucid thinking and conversation noted. HEENT: PERRLA, EOMI.   Neck: no LAD, mass, or thyromegaly. CV: RRR, no m/r/g LUNGS: CTA bilat, nonlabored. NEURO: no tremor or tics noted on observation.  Coordination intact. CN 2-12 grossly intact bilaterally, strength 5/5 in all extremeties.  No ataxia.   LABS:  Last CBC Lab Results  Component Value Date   WBC 10.9 (H) 02/13/2022   HGB 16.8 02/13/2022   HCT 49.6 02/13/2022   MCV 86.6 02/13/2022   MCH 29.3 02/13/2022   RDW 13.2 02/13/2022   PLT 246 02/13/2022   Last metabolic panel Lab Results  Component Value Date   GLUCOSE 128 (H) 02/13/2022   NA 132 (L) 02/13/2022   K 3.6 02/13/2022   CL 99 02/13/2022   CO2 20 (L) 02/13/2022   BUN 18 02/13/2022   CREATININE 0.92 02/13/2022   GFRNONAA >60 02/13/2022   CALCIUM 8.7 (L) 02/13/2022   PROT 8.0 02/13/2022   ALBUMIN 4.1 02/13/2022   BILITOT 0.9 02/13/2022   ALKPHOS 65 02/13/2022   AST 35 02/13/2022   ALT 48 (H) 02/13/2022   ANIONGAP 13 02/13/2022   Last lipids Lab Results  Component Value Date   CHOL 167 10/31/2012   HDL 57.40 10/31/2012   LDLCALC 95 10/31/2012   TRIG 72.0 10/31/2012   CHOLHDL 3 10/31/2012   Last hemoglobin A1c Lab Results  Component Value Date   HGBA1C 5.5 01/21/2020   IMPRESSION AND PLAN:  #1 adult ADD, GAD, history of major depressive disorder. Doing well overall on current medication  regimen. Continue Wellbutrin XL 300 mg daily, methylphenidate ER 54 mg daily. CSC and UDS UTD.  #2 hypertension. He has declined medications and continues to prefer to work on improving diet, exercise, and losing weight.  #3 restless leg syndrome.  He is not at the point of wanting to try medication. He wants to stick with leg stretching and hot Epsom salt baths as needed.  An After Visit Summary was printed and given to the patient.  FOLLOW UP:  Return in about 3 months (around 10/02/2022) for annual CPE (fasting).  Signed:  [name], MD           07/02/2022

## 2022-08-27 ENCOUNTER — Encounter: Payer: Self-pay | Admitting: Family Medicine

## 2022-08-27 ENCOUNTER — Telehealth: Payer: 59 | Admitting: Family Medicine

## 2022-08-27 VITALS — BP 137/89 | Wt 341.0 lb

## 2022-08-27 DIAGNOSIS — R6884 Jaw pain: Secondary | ICD-10-CM | POA: Diagnosis not present

## 2022-08-27 DIAGNOSIS — K047 Periapical abscess without sinus: Secondary | ICD-10-CM | POA: Diagnosis not present

## 2022-08-27 MED ORDER — CLINDAMYCIN HCL 300 MG PO CAPS: ORAL_CAPSULE | ORAL | 0 refills | Status: DC

## 2022-08-27 NOTE — Progress Notes (Signed)
Virtual Visit via Video Note  I connected with Cesar Young  on 08/27/22 at  4:00 PM EDT by a video enabled telemedicine application and verified that I am speaking with the correct person using two identifiers.  Location patient: [place] Location provider:work or home office Persons participating in the virtual visit: patient, provider  I discussed the limitations and requested verbal permission for telemedicine visit. The patient expressed understanding and agreed to proceed.   HPI: Has tooth pain on the right lower side. History of dental abscess, got treated with clindamycin by his dentist and was set to go back and get the tooth taken care of.  He got ill and could not keep that appointment and he never made a follow-up.  Now in the last week he has started to get tooth pain again, persistent and moderate to severe intensity. No facial or neck swelling.  No fever.  ROS: See pertinent positives and negatives per HPI.  Past Medical History:  Diagnosis Date   Adult ADHD    Allergic rhinitis    Anxiety and depression    resp well to wellbutrin   Asthma childhood   No albuterol requirement in years, then acute flare 02/2012   COVID-19 virus infection 07/2020   ED (erectile dysfunction) 2014   Herpes zoster 11/2010   Morbid obesity (HCC)    Plantar fasciitis    [employer] orthotics helping   Thyroid nodule summer 2019   u/s -->2.1 cm-->bx neg.  Has f/u 03/2018.   Tobacco abuse 12/28/2011   Quit smoking 2019, then took up dipping a lot.    Past Surgical History:  Procedure Laterality Date   ADENOIDECTOMY  1993   BIOPSY THYROID  08/2017   NEG for malignancy   WISDOM TOOTH EXTRACTION  2009     Current Outpatient Medications:    buPROPion (WELLBUTRIN XL) 300 MG 24 hr tablet, Take 1 tablet (300 mg total) by mouth daily., Disp: 90 tablet, Rfl: 1   Famotidine-Ca Carb-Mag Hydrox (PEPCID COMPLETE PO), Take by mouth., Disp: , Rfl:    Methylphenidate HCl ER 54 MG TB24, Take 1 tablet by mouth  daily with breakfast., Disp: 90 tablet, Rfl: 0   Omeprazole Magnesium (PRILOSEC OTC PO), Take by mouth daily., Disp: , Rfl:    tadalafil (CIALIS) 20 MG tablet, Take 20 mg by mouth as needed., Disp: , Rfl:    azelastine (ASTELIN) 0.1 % nasal spray, Place 1 spray into both nostrils 2 (two) times daily. Use in each nostril as directed (Patient not taking: Reported on 04/02/2022), Disp: 30 mL, Rfl: 5   ondansetron (ZOFRAN) 4 MG tablet, Take 1 tablet (4 mg total) by mouth every 6 (six) hours. (Patient not taking: Reported on 04/02/2022), Disp: 12 tablet, Rfl: 0  EXAM:  VITALS per patient if applicable:     08/27/2022    4:12 PM 07/02/2022    1:25 PM 07/02/2022    1:04 PM  Vitals with BMI  Weight 341 lbs  340 lbs 13 oz  Systolic 137 140 657  Diastolic 89 80 88  Pulse   82    GENERAL: alert, oriented, appears well and in no acute distress  HEENT: atraumatic, conjunttiva clear, no obvious abnormalities on inspection of external nose and ears  NECK: normal movements of the head and neck  LUNGS: on inspection no signs of respiratory distress, breathing rate appears normal, no obvious gross SOB, gasping or wheezing  CV: no obvious cyanosis  MS: moves all visible extremities without noticeable abnormality  PSYCH/NEURO: pleasant and cooperative, no obvious depression or anxiety, speech and thought processing grossly intact  LABS: none  ASSESSMENT AND PLAN:  Discussed the following assessment and plan:  Tooth abscess, right mandibular molar.  Patient has penicillin allergy. Clindamycin 300 mg 3 times daily x 14 days prescribed. He is in the process of setting up follow-up with his dentist to get this definitively treated.   I discussed the assessment and treatment plan with the patient. The patient was provided an opportunity to ask questions and all were answered. The patient agreed with the plan and demonstrated an understanding of the instructions.  F/u: If not improving significantly  3-5 d   Signed:  [name], MD           08/27/2022

## 2022-09-04 ENCOUNTER — Telehealth (INDEPENDENT_AMBULATORY_CARE_PROVIDER_SITE_OTHER): Payer: 59 | Admitting: Family Medicine

## 2022-09-04 ENCOUNTER — Encounter: Payer: Self-pay | Admitting: Family Medicine

## 2022-09-04 VITALS — BP 132/81 | Temp 97.9°F | Wt 340.0 lb

## 2022-09-04 DIAGNOSIS — U071 COVID-19: Secondary | ICD-10-CM

## 2022-09-04 MED ORDER — BENZONATATE 200 MG PO CAPS: 200.0000 mg | ORAL_CAPSULE | Freq: Two times a day (BID) | ORAL | 0 refills | Status: DC | PRN

## 2022-09-04 MED ORDER — PREDNISONE 50 MG PO TABS: 50.0000 mg | ORAL_TABLET | Freq: Every day | ORAL | 0 refills | Status: DC

## 2022-09-04 MED ORDER — ALBUTEROL SULFATE HFA 108 (90 BASE) MCG/ACT IN AERS: 2.0000 | INHALATION_SPRAY | Freq: Four times a day (QID) | RESPIRATORY_TRACT | 1 refills | Status: AC | PRN

## 2022-09-04 NOTE — Telephone Encounter (Signed)
Albuterol rx sent

## 2022-09-04 NOTE — Progress Notes (Signed)
VIRTUAL VISIT VIA VIDEO  I connected with Cesar Young on 09/04/22 at 11:40 AM EDT by a video enabled telemedicine application and verified that I am speaking with the correct person using two identifiers. Location patient: Home Location provider: [facility], Office Persons participating in the virtual visit: Patient, Dr. Claiborne Billings and Ivonne Andrew, CMA  I discussed the limitations of evaluation and management by telemedicine and the availability of in person appointments. The patient expressed understanding and agreed to proceed.    Cesar Young , [birth year], 37 y.o., male MRN: [id number] Patient Care Team    Relationship Specialty Notifications Start End  McGowen, Maryjean Morn, MD PCP - General Family Medicine  03/11/18   Marlene Lard, MD Consulting Physician Endocrinology  12/05/17     Chief Complaint  Patient presents with   Covid Positive    Tested positive 08/12; congestion, body ache, chills, nausea taking mucinex dm; currently on Abx has 1 week left     Subjective: Cesar Young is a 37 y.o. Pt presents for an OV with complaints of covid positive illness  of 2 days duration. He tested positive yesterday at home for covid.  Associated symptoms include nasal congestion, body aches, chills, dry cough. He is a former smoker, now vaping,  and at higher risk of complications.  He has had covid in the past and tolerated paxlovid. He is currently prescribed clindamycin for an infected tooth ,he is taking mucinex Dm.      07/02/2022    1:09 PM 02/05/2022    2:33 PM 01/01/2022   12:57 PM 03/03/2020    3:25 PM 12/04/2018    3:39 PM  Depression screen PHQ 2/9  Decreased Interest 0 0 0 0 0  Down, Depressed, Hopeless 0 0 0 0 0  PHQ - 2 Score 0 0 0 0 0  Altered sleeping 1 0     Tired, decreased energy 1 0     Change in appetite 1 0     Feeling bad or failure about yourself  0 0     Trouble concentrating 0 0     Moving slowly or fidgety/restless 0 0     Suicidal  thoughts 0 0     PHQ-9 Score 3 0     Difficult doing work/chores Not difficult at all Not difficult at all       Allergies  Allergen Reactions   Cephalosporins    Penicillins    Social History   Social History Narrative   Single.  No children.   [employer] grad, college x 2 yrs ([employer] copy).   Currently working for [employer], plans on taking one class at [employer] soon in attempt to finish his associates of Arts degree--ultimately wants to get BA in Marketing at [employer].   Worked for [employer] x 3 yrs.   Smokes 1 pack cigs/day x 10 yrs as of 11/2017.     Occ alcohol--2-3 beers at a time--denies abuse.  No drug use.   No consistent exercise.  Diet is poor.   Past Medical History:  Diagnosis Date   Adult ADHD    Allergic rhinitis    Anxiety and depression    resp well to wellbutrin   Asthma childhood   No albuterol requirement in years, then acute flare 02/2012   COVID-19 virus infection 07/2020   ED (erectile dysfunction) 2014   Herpes zoster 11/2010   Morbid obesity (HCC)  Plantar fasciitis    [employer] orthotics helping   Thyroid nodule summer 2019   u/s -->2.1 cm-->bx neg.  Has f/u 03/2018.   Tobacco abuse 12/28/2011   Quit smoking 2019, then took up dipping a lot.   Past Surgical History:  Procedure Laterality Date   ADENOIDECTOMY  1993   BIOPSY THYROID  08/2017   NEG for malignancy   WISDOM TOOTH EXTRACTION  2009   Family History  Problem Relation Age of Onset   GER disease Mother    Cancer Father        thyroid cancer ("non-familial type"   Depression Father    Allergies as of 09/04/2022       Reactions   Cephalosporins    Penicillins         Medication List        Accurate as of September 04, 2022 11:49 AM. If you have any questions, ask your nurse or doctor.          STOP taking these medications    ondansetron 4 MG tablet Commonly known as: ZOFRAN Stopped by: Felix Pacini       TAKE these medications    azelastine  0.1 % nasal spray Commonly known as: ASTELIN Place 1 spray into both nostrils 2 (two) times daily. Use in each nostril as directed   benzonatate 200 MG capsule Commonly known as: TESSALON Take 1 capsule (200 mg total) by mouth 2 (two) times daily as needed for cough. Started by: Felix Pacini   buPROPion 300 MG 24 hr tablet Commonly known as: WELLBUTRIN XL Take 1 tablet (300 mg total) by mouth daily.   clindamycin 300 MG capsule Commonly known as: CLEOCIN 1 cap po tid x 14 days   Methylphenidate HCl ER 54 MG Tb24 Take 1 tablet by mouth daily with breakfast.   PEPCID COMPLETE PO Take by mouth.   predniSONE 50 MG tablet Commonly known as: DELTASONE Take 1 tablet (50 mg total) by mouth daily with breakfast. Started by: Felix Pacini   PRILOSEC OTC PO Take by mouth daily.   tadalafil 20 MG tablet Commonly known as: CIALIS Take 20 mg by mouth as needed.        All past medical history, surgical history, allergies, family history, immunizations andmedications were updated in the EMR today and reviewed under the history and medication portions of their EMR.     Review of Systems  Constitutional:  Positive for chills and malaise/fatigue.  HENT:  Positive for congestion.   Gastrointestinal:  Positive for nausea. Negative for abdominal pain, constipation, diarrhea and vomiting.  Musculoskeletal:  Positive for myalgias.  Skin:  Negative for rash.  Neurological:  Positive for dizziness and headaches.   Negative, with the exception of above mentioned in HPI   Objective:  BP 132/81   Temp 97.9 F (36.6 C)   Wt (!) 340 lb (154.2 kg)   BMI 48.78 kg/m  Body mass index is 48.78 kg/m. Physical Exam Vitals and nursing note reviewed.  Constitutional:      General: He is not in acute distress.    Appearance: Normal appearance. He is not toxic-appearing.  HENT:     Head: Normocephalic and atraumatic.  Eyes:     General: No scleral icterus.       Right eye: No discharge.         Left eye: No discharge.     Conjunctiva/sclera: Conjunctivae normal.  Pulmonary:     Effort: Pulmonary effort is normal.  Musculoskeletal:     Cervical back: Normal range of motion.  Skin:    Findings: No rash.  Neurological:     Mental Status: He is alert and oriented to person, place, and time. Mental status is at baseline.  Psychiatric:        Mood and Affect: Mood normal.        Behavior: Behavior normal.        Thought Content: Thought content normal.        Judgment: Judgment normal.     No results found. No results found. No results found for this or any previous visit (from the past 24 hour(s)).  Assessment/Plan: NEYLAN LANDFAIR is a 37 y.o. male present for OV for  Positive self-administered antigen test for COVID-19 Rest, hydrate.  Continue mucinex (DM if cough) Avoided start of paxlovid since he is in the middle of clindamycin treatment for infected tooth.  Prednisone burst and tessalon perles prescribed.  Reviewed home care instructions for COVID. Advised self-isolation at home for at least 5 days. After 5 days, if improved and fever resolved, can be in public, but should wear a mask around others for an additional 5 days. If symptoms, esp, dyspnea develops/worsens, recommend in-person evaluation at either an urgent care or the emergency room.  Reviewed expectations re: course of current medical issues. Discussed self-management of symptoms. Outlined signs and symptoms indicating need for more acute intervention. Patient verbalized understanding and all questions were answered. Patient received an After-Visit Summary.    No orders of the defined types were placed in this encounter.  Meds ordered this encounter  Medications   predniSONE (DELTASONE) 50 MG tablet    Sig: Take 1 tablet (50 mg total) by mouth daily with breakfast.    Dispense:  5 tablet    Refill:  0   benzonatate (TESSALON) 200 MG capsule    Sig: Take 1 capsule (200 mg total) by mouth 2  (two) times daily as needed for cough.    Dispense:  20 capsule    Refill:  0   Referral Orders  No referral(s) requested today     Note is dictated utilizing voice recognition software. Although note has been proof read prior to signing, occasional typographical errors still can be missed. If any questions arise, please do not hesitate to call for verification.   electronically signed by:  Felix Pacini, DO  [facility] [place]

## 2022-09-06 ENCOUNTER — Encounter (INDEPENDENT_AMBULATORY_CARE_PROVIDER_SITE_OTHER): Payer: Self-pay

## 2022-09-26 ENCOUNTER — Other Ambulatory Visit: Payer: Self-pay | Admitting: Family Medicine

## 2022-10-02 NOTE — Patient Instructions (Signed)

## 2022-10-03 ENCOUNTER — Encounter: Payer: Self-pay | Admitting: Family Medicine

## 2022-10-03 ENCOUNTER — Ambulatory Visit: Payer: 59 | Admitting: Family Medicine

## 2022-10-03 VITALS — BP 126/83 | HR 96 | Temp 98.2°F | Ht 69.0 in | Wt 359.6 lb

## 2022-10-03 DIAGNOSIS — R7301 Impaired fasting glucose: Secondary | ICD-10-CM | POA: Diagnosis not present

## 2022-10-03 DIAGNOSIS — Z0000 Encounter for general adult medical examination without abnormal findings: Secondary | ICD-10-CM

## 2022-10-03 DIAGNOSIS — I10 Essential (primary) hypertension: Secondary | ICD-10-CM | POA: Diagnosis not present

## 2022-10-03 DIAGNOSIS — Z79899 Other long term (current) drug therapy: Secondary | ICD-10-CM | POA: Diagnosis not present

## 2022-10-03 DIAGNOSIS — F988 Other specified behavioral and emotional disorders with onset usually occurring in childhood and adolescence: Secondary | ICD-10-CM

## 2022-10-03 LAB — CBC WITH DIFFERENTIAL/PLATELET
Basophils Absolute: 0 10*3/uL (ref 0.0–0.1)
Basophils Relative: 0.7 % (ref 0.0–3.0)
Eosinophils Absolute: 0.3 10*3/uL (ref 0.0–0.7)
Eosinophils Relative: 5 % (ref 0.0–5.0)
HCT: 45.4 % (ref 39.0–52.0)
Hemoglobin: 15.1 g/dL (ref 13.0–17.0)
Lymphocytes Relative: 34.1 % (ref 12.0–46.0)
Lymphs Abs: 2.2 10*3/uL (ref 0.7–4.0)
MCHC: 33.2 g/dL (ref 30.0–36.0)
MCV: 90 fl (ref 78.0–100.0)
Monocytes Absolute: 0.8 10*3/uL (ref 0.1–1.0)
Monocytes Relative: 12.1 % — ABNORMAL HIGH (ref 3.0–12.0)
Neutro Abs: 3.1 10*3/uL (ref 1.4–7.7)
Neutrophils Relative %: 48.1 % (ref 43.0–77.0)
Platelets: 247 10*3/uL (ref 150.0–400.0)
RBC: 5.05 Mil/uL (ref 4.22–5.81)
RDW: 13.3 % (ref 11.5–15.5)
WBC: 6.4 10*3/uL (ref 4.0–10.5)

## 2022-10-03 LAB — COMPREHENSIVE METABOLIC PANEL
ALT: 42 U/L (ref 0–53)
AST: 25 U/L (ref 0–37)
Albumin: 4.2 g/dL (ref 3.5–5.2)
Alkaline Phosphatase: 65 U/L (ref 39–117)
BUN: 9 mg/dL (ref 6–23)
CO2: 28 meq/L (ref 19–32)
Calcium: 10 mg/dL (ref 8.4–10.5)
Chloride: 102 meq/L (ref 96–112)
Creatinine, Ser: 0.96 mg/dL (ref 0.40–1.50)
GFR: 102.1 mL/min (ref >60.00)
Glucose, Bld: 93 mg/dL (ref 70–99)
Potassium: 4 meq/L (ref 3.5–5.1)
Sodium: 138 meq/L (ref 135–145)
Total Bilirubin: 0.4 mg/dL (ref 0.2–1.2)
Total Protein: 7.2 g/dL (ref 6.0–8.3)

## 2022-10-03 LAB — HEMOGLOBIN A1C: Hgb A1c MFr Bld: 5.8 % (ref 4.6–6.5)

## 2022-10-03 LAB — LIPID PANEL
Cholesterol: 193 mg/dL (ref 0–200)
HDL: 55 mg/dL (ref >39.00)
LDL Cholesterol: 80 mg/dL (ref 0–99)
NonHDL: 137.83
Total CHOL/HDL Ratio: 4
Triglycerides: 290 mg/dL — ABNORMAL HIGH (ref 0.0–149.0)
VLDL: 58 mg/dL — ABNORMAL HIGH (ref 0.0–40.0)

## 2022-10-03 LAB — TSH: TSH: 1.82 u[IU]/mL (ref 0.35–5.50)

## 2022-10-03 MED ORDER — METHYLPHENIDATE HCL ER 54 MG PO TB24: 1.0000 | ORAL_TABLET | Freq: Every day | ORAL | 0 refills | Status: DC

## 2022-10-03 NOTE — Progress Notes (Signed)
Office Note 10/03/2022  CC:  Chief Complaint  Patient presents with   Annual Exam    None fasting Cpe   Patient is a 37 y.o. male who is here for annual health maintenance exam and 66-month follow-up adult ADD and hypertension. A/P as of last visit: "#1 adult ADD, GAD, history of major depressive disorder. Doing well overall on current medication regimen. Continue Wellbutrin XL 300 mg daily, methylphenidate ER 54 mg daily. CSC and UDS UTD.   #2 hypertension. He has declined medications and continues to prefer to work on improving diet, exercise, and losing weight.   #3 restless leg syndrome.  He is not at the point of wanting to try medication. He wants to stick with leg stretching and hot Epsom salt baths as needed."  INTERIM HX: Cesar Young is doing well although he is getting more more worried about his gradual weight gain. He knows he eats too much fast food and does not exercise nearly enough. He is starting to make some plans for dietary changes and has the plan of going slow, small steps and victories that he can sustain.  Pt states all is going well with the med at current dosing: much improved focus, concentration, task completion.  Less frustration, better multitasking, less impulsivity and restlessness.  Mood is stable.  No significant problem with bad anxiety. No side effects from the medication.  PMP AWARE reviewed today: most recent rx for methylphenidate ER 54 mg was filled 07/12/2022, # 90, rx by me. No red flags.  Past Medical History:  Diagnosis Date   Adult ADHD    Allergic rhinitis    Anxiety and depression    resp well to wellbutrin   Asthma childhood   No albuterol requirement in years, then acute flare 02/2012   COVID-19 virus infection 07/2020   ED (erectile dysfunction) 2014   Herpes zoster 11/2010   Morbid obesity (HCC)    Plantar fasciitis    [employer] orthotics helping   Thyroid nodule summer 2019   u/s -->2.1 cm-->bx neg.  Has f/u 03/2018.    Tobacco abuse 12/28/2011   Quit smoking 2019, then took up dipping a lot.    Past Surgical History:  Procedure Laterality Date   ADENOIDECTOMY  1993   BIOPSY THYROID  08/2017   NEG for malignancy   WISDOM TOOTH EXTRACTION  2009    Family History  Problem Relation Age of Onset   GER disease Mother    Cancer Father        thyroid cancer ("non-familial type"   Depression Father     Social History   Socioeconomic History   Marital status: Single    Spouse name: Not on file   Number of children: Not on file   Years of education: Not on file   Highest education level: Associate degree: academic program  Occupational History   Not on file  Tobacco Use   Smoking status: Former    Current packs/day: 0.00    Average packs/day: 0.3 packs/day for 9.0 years (2.3 ttl pk-yrs)    Types: Cigarettes    Start date: 01/04/2009    Quit date: 01/04/2018    Years since quitting: 4.7   Smokeless tobacco: Current    Types: Snuff  Vaping Use   Vaping status: Never Used  Substance and Sexual Activity   Alcohol use: Yes    Comment: occasionally 1 or 2 beers every couple months   Drug use: No   Sexual activity: Yes  Partners: Female  Other Topics Concern   Not on file  Social History Narrative   Single.  No children.   [employer] grad, college x 2 yrs ([employer] copy).   Currently working for [employer], plans on taking one class at [employer] soon in attempt to finish his associates of Arts degree--ultimately wants to get BA in Marketing at [employer].   Worked for [employer] x 3 yrs.   Smokes 1 pack cigs/day x 10 yrs as of 11/2017.     Occ alcohol--2-3 beers at a time--denies abuse.  No drug use.   No consistent exercise.  Diet is poor.   Social Determinants of Health   Financial Resource Strain: Low Risk  (12/31/2021)   Overall Financial Resource Strain (CARDIA)    Difficulty of Paying Living Expenses: Not very hard  Food Insecurity: No Food Insecurity (12/31/2021)    Hunger Vital Sign    Worried About Running Out of Food in the Last Year: Never true    Ran Out of Food in the Last Year: Never true  Transportation Needs: No Transportation Needs (12/31/2021)   PRAPARE - Administrator, Civil Service (Medical): No    Lack of Transportation (Non-Medical): No  Physical Activity: Sufficiently Active (12/31/2021)   Exercise Vital Sign    Days of Exercise per Week: 4 days    Minutes of Exercise per Session: 60 min  Stress: No Stress Concern Present (12/31/2021)   [employer] of Occupational Health - Occupational Stress Questionnaire    Feeling of Stress : Only a little  Social Connections: Unknown (05/12/2022)   Received from [facility]   Social Network    Social Network: Not on file  Intimate Partner Violence: Unknown (05/12/2022)   Received from [facility]   HITS    Physically Hurt: Not on file    Insult or Talk Down To: Not on file    Threaten Physical Harm: Not on file    Scream or Curse: Not on file    Outpatient Medications Prior to Visit  Medication Sig Dispense Refill   albuterol (VENTOLIN HFA) 108 (90 Base) MCG/ACT inhaler Inhale 2 puffs into the lungs every 6 (six) hours as needed for wheezing or shortness of breath. 8 g 1   azelastine (ASTELIN) 0.1 % nasal spray Place 1 spray into both nostrils 2 (two) times daily. Use in each nostril as directed 30 mL 5   buPROPion (WELLBUTRIN XL) 300 MG 24 hr tablet TAKE 1 TABLET BY MOUTH EVERY DAY 30 tablet 0   Famotidine-Ca Carb-Mag Hydrox (PEPCID COMPLETE PO) Take by mouth.     Methylphenidate HCl ER 54 MG TB24 Take 1 tablet by mouth daily with breakfast. 90 tablet 0   Omeprazole Magnesium (PRILOSEC OTC PO) Take by mouth daily.     tadalafil (CIALIS) 20 MG tablet Take 20 mg by mouth as needed.     benzonatate (TESSALON) 200 MG capsule Take 1 capsule (200 mg total) by mouth 2 (two) times daily as needed for cough. 20 capsule 0   clindamycin (CLEOCIN)  300 MG capsule 1 cap po tid x 14 days 42 capsule 0   predniSONE (DELTASONE) 50 MG tablet Take 1 tablet (50 mg total) by mouth daily with breakfast. 5 tablet 0   No facility-administered medications prior to visit.    Allergies  Allergen Reactions   Cephalosporins    Penicillins     Review of Systems  Constitutional:  Negative for appetite change, chills, fatigue and fever.  HENT:  Negative for congestion, dental problem, ear pain and sore throat.   Eyes:  Negative for discharge, redness and visual disturbance.  Respiratory:  Negative for cough, chest tightness, shortness of breath and wheezing.   Cardiovascular:  Negative for chest pain, palpitations and leg swelling.  Gastrointestinal:  Negative for abdominal pain, blood in stool, diarrhea, nausea and vomiting.  Genitourinary:  Negative for difficulty urinating, dysuria, flank pain, frequency, hematuria and urgency.  Musculoskeletal:  Negative for arthralgias, back pain, joint swelling, myalgias and neck stiffness.  Skin:  Negative for pallor and rash.  Neurological:  Negative for dizziness, speech difficulty, weakness and headaches.  Hematological:  Negative for adenopathy. Does not bruise/bleed easily.  Psychiatric/Behavioral:  Negative for confusion and sleep disturbance. The patient is not nervous/anxious.     PE;    10/03/2022    8:57 AM 09/04/2022   11:34 AM 08/27/2022    4:12 PM  Vitals with BMI  Height 5\' 9"     Weight 359 lbs 10 oz 340 lbs 341 lbs  BMI 53.08    Systolic 126 132 161  Diastolic 83 81 89  Pulse 96     Gen: Alert, well appearing.  Patient is oriented to person, place, time, and situation. AFFECT: pleasant, lucid thought and speech. ENT: Ears: EACs clear, normal epithelium.  TMs with good light reflex and landmarks bilaterally.  Eyes: no injection, icteris, swelling, or exudate.  EOMI, PERRLA. Nose: no drainage or turbinate edema/swelling.  No injection or focal lesion.  Mouth: lips without  lesion/swelling.  Oral mucosa pink and moist.  Dentition intact and without obvious caries or gingival swelling.  Oropharynx without erythema, exudate, or swelling.  Neck: supple/nontender.  No LAD, mass, or TM.  Carotid pulses 2+ bilaterally, without bruits. CV: RRR, no m/r/g.   LUNGS: CTA bilat, nonlabored resps, good aeration in all lung fields. ABD: soft, NT, ND, BS normal.  No hepatospenomegaly or mass.  No bruits. EXT: no clubbing, cyanosis, or edema.  Musculoskeletal: no joint swelling, erythema, warmth, or tenderness.  ROM of all joints intact. Skin - no sores or suspicious lesions or rashes or color changes  Pertinent labs:  No results found for: "TSH" Lab Results  Component Value Date   WBC 10.9 (H) 02/13/2022   HGB 16.8 02/13/2022   HCT 49.6 02/13/2022   MCV 86.6 02/13/2022   PLT 246 02/13/2022   Lab Results  Component Value Date   CREATININE 0.92 02/13/2022   BUN 18 02/13/2022   NA 132 (L) 02/13/2022   K 3.6 02/13/2022   CL 99 02/13/2022   CO2 20 (L) 02/13/2022   Lab Results  Component Value Date   ALT 48 (H) 02/13/2022   AST 35 02/13/2022   ALKPHOS 65 02/13/2022   BILITOT 0.9 02/13/2022   Lab Results  Component Value Date   CHOL 167 10/31/2012   Lab Results  Component Value Date   HDL 57.40 10/31/2012   Lab Results  Component Value Date   LDLCALC 95 10/31/2012   Lab Results  Component Value Date   TRIG 72.0 10/31/2012   Lab Results  Component Value Date   CHOLHDL 3 10/31/2012   Lab Results  Component Value Date   HGBA1C 5.5 01/21/2020   ASSESSMENT AND PLAN:   #1 health maintenance exam: Reviewed age and gender appropriate health maintenance issues (prudent diet, regular exercise, health risks of tobacco and excessive alcohol, use of seatbelts, fire  alarms in home, use of sunscreen).  Also reviewed age and gender appropriate health screening as well as vaccine recommendations. Vaccines: Flu-->declined. Labs: Fasting health panel, hemoglobin  A1c (IFG).  #2 adult ADD, GAD, history of major depressive disorder. Doing well overall on current medication regimen. Continue Wellbutrin XL 300 mg daily, methylphenidate ER 54 mg daily. CSC and UDS UTD.  #3 hypertension.  Just minimal diastolic pressure elevation today. We are still proceeding with improvement in therapeutic lifestyle change as our approach to this.  An After Visit Summary was printed and given to the patient.  FOLLOW UP:  No follow-ups on file.  Signed:  [name], MD           10/03/2022

## 2022-10-05 ENCOUNTER — Encounter: Payer: Self-pay | Admitting: Family Medicine

## 2022-10-23 ENCOUNTER — Other Ambulatory Visit: Payer: Self-pay | Admitting: Family Medicine

## 2022-12-31 ENCOUNTER — Ambulatory Visit: Payer: 59 | Admitting: Family Medicine

## 2022-12-31 VITALS — BP 160/92 | HR 95 | Wt 366.6 lb

## 2022-12-31 DIAGNOSIS — I10 Essential (primary) hypertension: Secondary | ICD-10-CM | POA: Diagnosis not present

## 2022-12-31 DIAGNOSIS — F988 Other specified behavioral and emotional disorders with onset usually occurring in childhood and adolescence: Secondary | ICD-10-CM | POA: Diagnosis not present

## 2022-12-31 DIAGNOSIS — Z23 Encounter for immunization: Secondary | ICD-10-CM

## 2022-12-31 DIAGNOSIS — F411 Generalized anxiety disorder: Secondary | ICD-10-CM | POA: Diagnosis not present

## 2022-12-31 MED ORDER — METHYLPHENIDATE HCL ER (OSM) 36 MG PO TBCR: 36.0000 mg | EXTENDED_RELEASE_TABLET | Freq: Every day | ORAL | 0 refills | Status: DC

## 2022-12-31 NOTE — Progress Notes (Signed)
OFFICE VISIT  12/31/2022  CC:  Chief Complaint  Patient presents with   Medical Management of Chronic Issues    Pt mentions lowering the dosage on Methylphenidate; states he has started to develop HA when the med wears off.     Patient is a 37 y.o. male who presents for 51-month follow-up adult ADD, anxiety/depression, and hypertension. A/P as of last visit: "#1 adult ADD, GAD, history of major depressive disorder. Doing well overall on current medication regimen. Continue Wellbutrin XL 300 mg daily, methylphenidate ER 54 mg daily. CSC and UDS UTD.   #2 hypertension.  Just minimal diastolic pressure elevation today. We are still proceeding with improvement in therapeutic lifestyle change as our approach to this."  INTERIM HX: Tovi is feeling well. He has made some dietary changes and is walking more lately.  He has a new job at [employer] and is optimistic about things.  Mood stable, no panic attacks or bad periods of anxiety.   Pt states all is going well with the med at current dosing: much improved focus, concentration, task completion.  Less frustration, better multitasking, less impulsivity and restlessness.  Mood is stable. No side effects from the medication.   PMP AWARE reviewed today: most recent rx for methylphenidate ER 54 mg was filled 10/11/2022, # 90, rx by me. No red flags.   Past Medical History:  Diagnosis Date   Adult ADHD    Allergic rhinitis    Anxiety and depression    resp well to wellbutrin   Asthma childhood   No albuterol requirement in years, then acute flare 02/2012   COVID-19 virus infection 07/2020   ED (erectile dysfunction) 2014   Herpes zoster 11/2010   Hypertriglyceridemia    Morbid obesity (HCC)    Plantar fasciitis    [employer] orthotics helping   Thyroid nodule summer 2019   u/s -->2.1 cm-->bx neg.  Has f/u 03/2018.   Tobacco abuse 12/28/2011   Quit smoking 2019, then took up dipping a lot.    Past Surgical History:   Procedure Laterality Date   ADENOIDECTOMY  1993   BIOPSY THYROID  08/2017   NEG for malignancy   WISDOM TOOTH EXTRACTION  2009    Outpatient Medications Prior to Visit  Medication Sig Dispense Refill   albuterol (VENTOLIN HFA) 108 (90 Base) MCG/ACT inhaler Inhale 2 puffs into the lungs every 6 (six) hours as needed for wheezing or shortness of breath. 8 g 1   azelastine (ASTELIN) 0.1 % nasal spray Place 1 spray into both nostrils 2 (two) times daily. Use in each nostril as directed 30 mL 5   buPROPion (WELLBUTRIN XL) 300 MG 24 hr tablet TAKE 1 TABLET BY MOUTH EVERY DAY 90 tablet 0   Famotidine-Ca Carb-Mag Hydrox (PEPCID COMPLETE PO) Take by mouth.     Omeprazole Magnesium (PRILOSEC OTC PO) Take by mouth daily.     tadalafil (CIALIS) 20 MG tablet Take 20 mg by mouth as needed.     Methylphenidate HCl ER 54 MG TB24 Take 1 tablet by mouth daily with breakfast. 90 tablet 0   No facility-administered medications prior to visit.    Allergies  Allergen Reactions   Cephalosporins    Penicillins     Review of Systems As per HPI  PE:    12/31/2022   11:23 AM 10/03/2022    8:57 AM 09/04/2022   11:34 AM  Vitals with BMI  Height  5\' 9"    Weight 366 lbs 10 oz 359  lbs 10 oz 340 lbs  BMI  53.08   Systolic 160 126 696  Diastolic 92 83 81  Pulse 95 96      Physical Exam  Gen: Alert, well appearing.  Patient is oriented to person, place, time, and situation. AFFECT: pleasant, lucid thought and speech. No further exam today  LABS:  Last CBC Lab Results  Component Value Date   WBC 6.4 10/03/2022   HGB 15.1 10/03/2022   HCT 45.4 10/03/2022   MCV 90.0 10/03/2022   MCH 29.3 02/13/2022   RDW 13.3 10/03/2022   PLT 247.0 10/03/2022   Last metabolic panel Lab Results  Component Value Date   GLUCOSE 93 10/03/2022   NA 138 10/03/2022   K 4.0 10/03/2022   CL 102 10/03/2022   CO2 28 10/03/2022   BUN 9 10/03/2022   CREATININE 0.96 10/03/2022   GFR 102.10 10/03/2022   CALCIUM  10.0 10/03/2022   PROT 7.2 10/03/2022   ALBUMIN 4.2 10/03/2022   BILITOT 0.4 10/03/2022   ALKPHOS 65 10/03/2022   AST 25 10/03/2022   ALT 42 10/03/2022   ANIONGAP 13 02/13/2022   Last lipids Lab Results  Component Value Date   CHOL 193 10/03/2022   HDL 55.00 10/03/2022   LDLCALC 80 10/03/2022   TRIG 290.0 (H) 10/03/2022   CHOLHDL 4 10/03/2022   Last hemoglobin A1c Lab Results  Component Value Date   HGBA1C 5.8 10/03/2022   Last thyroid functions Lab Results  Component Value Date   TSH 1.82 10/03/2022   IMPRESSION AND PLAN:  #1 adult ADD. He wants to try stepping down his Concerta to a 36 mg dose to see how he does.  #90 prescribed today.  2.  GAD, history of major depression. He is doing very well on Wellbutrin XL 300 mg a day.  3.  Hypertension, mild to moderate elevations here today. He prefers to continue to work on therapeutic lifestyle changes.  An After Visit Summary was printed and given to the patient.  FOLLOW UP: Return in about 3 months (around 03/31/2023) for routine chronic illness f/u. Next CPE 09/2023 Signed:  [name], MD           12/31/2022

## 2023-01-02 ENCOUNTER — Ambulatory Visit: Payer: 59 | Admitting: Family Medicine

## 2023-01-27 ENCOUNTER — Other Ambulatory Visit: Payer: Self-pay | Admitting: Family Medicine

## 2023-03-20 ENCOUNTER — Encounter: Payer: Self-pay | Admitting: Family Medicine

## 2023-03-20 ENCOUNTER — Ambulatory Visit (INDEPENDENT_AMBULATORY_CARE_PROVIDER_SITE_OTHER): Payer: Self-pay | Admitting: Family Medicine

## 2023-03-20 VITALS — BP 109/69 | HR 86 | Temp 97.8°F | Wt 359.8 lb

## 2023-03-20 DIAGNOSIS — M65312 Trigger thumb, left thumb: Secondary | ICD-10-CM

## 2023-03-20 NOTE — Progress Notes (Signed)
 OFFICE VISIT  03/24/2023  CC:  Chief Complaint  Patient presents with   Hand Pain    Left thumb x1wk    Patient is a 38 y.o. male who presents for thumb concern.  HPI: About 2 weeks history of uncomfortable triggering of the left thumb.  Seems to be progressing.  Preceding injury or overuse. Had to this work yesterday due to this. Has not tried ice, heat, or splinting.  Has not tried resting it.  Past Medical History:  Diagnosis Date   Adult ADHD    Allergic rhinitis    Anxiety and depression    resp well to wellbutrin   Asthma childhood   No albuterol requirement in years, then acute flare 02/2012   COVID-19 virus infection 07/2020   ED (erectile dysfunction) 2014   Herpes zoster 11/2010   Hypertriglyceridemia    Morbid obesity (HCC)    Plantar fasciitis    [employer] orthotics helping   Thyroid nodule summer 2019   u/s -->2.1 cm-->bx neg.  Has f/u 03/2018.   Tobacco abuse 12/28/2011   Quit smoking 2019, then took up dipping a lot.    Past Surgical History:  Procedure Laterality Date   ADENOIDECTOMY  1993   BIOPSY THYROID  08/2017   NEG for malignancy   WISDOM TOOTH EXTRACTION  2009    Outpatient Medications Prior to Visit  Medication Sig Dispense Refill   albuterol (VENTOLIN HFA) 108 (90 Base) MCG/ACT inhaler Inhale 2 puffs into the lungs every 6 (six) hours as needed for wheezing or shortness of breath. 8 g 1   azelastine (ASTELIN) 0.1 % nasal spray Place 1 spray into both nostrils 2 (two) times daily. Use in each nostril as directed 30 mL 5   buPROPion (WELLBUTRIN XL) 300 MG 24 hr tablet TAKE 1 TABLET BY MOUTH EVERY DAY 90 tablet 0   Famotidine-Ca Carb-Mag Hydrox (PEPCID COMPLETE PO) Take by mouth.     methylphenidate 36 MG PO CR tablet Take 1 tablet (36 mg total) by mouth daily. 90 tablet 0   Omeprazole Magnesium (PRILOSEC OTC PO) Take by mouth daily.     tadalafil (CIALIS) 20 MG tablet Take 20 mg by mouth as needed.     No facility-administered medications  prior to visit.    Allergies  Allergen Reactions   Cephalosporins    Penicillins     Review of Systems  As per HPI  PE:    03/20/2023    1:18 PM 12/31/2022   11:23 AM 10/03/2022    8:57 AM  Vitals with BMI  Height   5\' 9"   Weight 359 lbs 13 oz 366 lbs 10 oz 359 lbs 10 oz  BMI   53.08  Systolic 109 160 130  Diastolic 69 92 83  Pulse 86 95 96     Physical Exam  Gen: Alert, well appearing.  Patient is oriented to person, place, time, and situation. Left thumb with some tenderness to palpation over the MCP and IP area.  Pain with flexion.  No palpable nodule.  Brief fixed flexion.  LABS:  Last metabolic panel Lab Results  Component Value Date   GLUCOSE 93 10/03/2022   NA 138 10/03/2022   K 4.0 10/03/2022   CL 102 10/03/2022   CO2 28 10/03/2022   BUN 9 10/03/2022   CREATININE 0.96 10/03/2022   GFR 102.10 10/03/2022   CALCIUM 10.0 10/03/2022   PROT 7.2 10/03/2022   ALBUMIN 4.2 10/03/2022   BILITOT 0.4 10/03/2022  ALKPHOS 65 10/03/2022   AST 25 10/03/2022   ALT 42 10/03/2022   ANIONGAP 13 02/13/2022   Last lipids Lab Results  Component Value Date   CHOL 193 10/03/2022   HDL 55.00 10/03/2022   LDLCALC 80 10/03/2022   TRIG 290.0 (H) 10/03/2022   CHOLHDL 4 10/03/2022   Last hemoglobin A1c Lab Results  Component Value Date   HGBA1C 5.8 10/03/2022    IMPRESSION AND PLAN:  Left trigger thumb. Discussed relative rest, icing. Fitted thumb splint today, recommended at least 8 hours in the splint daily for the next 4 days or so.  Range of motion exercises discussed. He deferred steroid injection today.  An After Visit Summary was printed and given to the patient.  FOLLOW UP: Return for as needed. Already has appointment set for 04/01/2023 Signed:  [name], MD           03/24/2023

## 2023-03-21 ENCOUNTER — Encounter: Payer: Self-pay | Admitting: Family Medicine

## 2023-03-29 ENCOUNTER — Ambulatory Visit: Payer: Self-pay | Admitting: Family Medicine

## 2023-03-29 VITALS — BP 127/73 | HR 92 | Wt 363.2 lb

## 2023-03-29 DIAGNOSIS — M65312 Trigger thumb, left thumb: Secondary | ICD-10-CM

## 2023-03-29 DIAGNOSIS — F988 Other specified behavioral and emotional disorders with onset usually occurring in childhood and adolescence: Secondary | ICD-10-CM

## 2023-03-29 DIAGNOSIS — F339 Major depressive disorder, recurrent, unspecified: Secondary | ICD-10-CM

## 2023-03-29 DIAGNOSIS — F411 Generalized anxiety disorder: Secondary | ICD-10-CM

## 2023-03-29 MED ORDER — METHYLPHENIDATE HCL ER (OSM) 36 MG PO TBCR: 36.0000 mg | EXTENDED_RELEASE_TABLET | Freq: Every day | ORAL | 0 refills | Status: DC

## 2023-03-29 NOTE — Progress Notes (Signed)
 OFFICE VISIT  03/29/2023  CC:  Chief Complaint  Patient presents with   Finger Concern    Pt states left thumb has not fully healed; no pain unless fully extending, still has some limited ROM    Patient is a 38 y.o. male who presents for follow-up left trigger thumb as well as follow-up anxiety/depression and adult ADD. I saw him 03/20/2023 and we started intermittent splinting.  INTERIM HX: No pain in the thumb anymore.  He cannot fully extend at the IP joint but if he pulls it with his other hand he can feel a little pop and it extends fully.  There is a brief pain when this happens.  He feels the pain on the volar aspect of the thumb mostly at the metacarpal phalangeal joint region and proximal phalanx region.  He is able to do all normal daily activities.  Pt states all is going well with the med at current dosing: much improved focus, concentration, task completion.  Less frustration, better multitasking, less impulsivity and restlessness.  Mood is stable. No side effects from the medication.  He has gone through a few tough things lately in addition to his trigger thumb.  He has had a tooth infection.  He is also currently grieving for the loss of his uncle who died approximately week ago.  PMP AWARE reviewed today: most recent rx for methylphenidate ER 36mg  was filled 12/31/23, # 90, rx by me. No red flags.  Review of systems: No headaches, no palpitations, no dizziness, no chest pain, no joint swelling, no lower extremity swelling.  Past Medical History:  Diagnosis Date   Adult ADHD    Allergic rhinitis    Anxiety and depression    resp well to wellbutrin   Asthma childhood   No albuterol requirement in years, then acute flare 02/2012   COVID-19 virus infection 07/2020   ED (erectile dysfunction) 2014   Herpes zoster 11/2010   Hypertriglyceridemia    Morbid obesity (HCC)    Plantar fasciitis    [employer] [facility] helping   Thyroid nodule summer 2019   u/s -->2.1  cm-->bx neg.  Has f/u 03/2018.   Tobacco abuse 12/28/2011   Quit smoking 2019, then took up dipping a lot.    Past Surgical History:  Procedure Laterality Date   ADENOIDECTOMY  1993   BIOPSY THYROID  08/2017   NEG for malignancy   WISDOM TOOTH EXTRACTION  2009    Outpatient Medications Prior to Visit  Medication Sig Dispense Refill   buPROPion (WELLBUTRIN XL) 300 MG 24 hr tablet TAKE 1 TABLET BY MOUTH EVERY DAY 90 tablet 0   Famotidine-Ca Carb-Mag Hydrox (PEPCID COMPLETE PO) Take by mouth.     methylphenidate 36 MG PO CR tablet Take 1 tablet (36 mg total) by mouth daily. 90 tablet 0   Omeprazole Magnesium (PRILOSEC OTC PO) Take by mouth daily.     tadalafil (CIALIS) 20 MG tablet Take 20 mg by mouth as needed.     albuterol (VENTOLIN HFA) 108 (90 Base) MCG/ACT inhaler Inhale 2 puffs into the lungs every 6 (six) hours as needed for wheezing or shortness of breath. (Patient not taking: Reported on 03/29/2023) 8 g 1   azelastine (ASTELIN) 0.1 % nasal spray Place 1 spray into both nostrils 2 (two) times daily. Use in each nostril as directed (Patient not taking: Reported on 03/29/2023) 30 mL 5   No facility-administered medications prior to visit.    Allergies  Allergen Reactions  Cephalosporins    Penicillins     Review of Systems As per HPI  PE:    03/29/2023    2:44 PM 03/20/2023    1:18 PM 12/31/2022   11:23 AM  Vitals with BMI  Weight 363 lbs 3 oz 359 lbs 13 oz 366 lbs 10 oz  Systolic 127 109 161  Diastolic 73 69 92  Pulse 92 86 95     Physical Exam  Gen: Alert, well appearing.  Patient is oriented to person, place, time, and situation. AFFECT: pleasant, lucid thought and speech. Left thumb without tenderness, erythema, or swelling.  He has full abduction, adduction, flexion, and extension at the wrist and MCP joints.  Flexion at the left thumb IP joint is fully intact and without pain.  He can extend to almost 0 degrees at the IP joint but has to manually extend further  than that.  There is a palpable and audible pop over the volar aspect of the thumb MCP joint when this happens.  LABS:  None  IMPRESSION AND PLAN:  #1 left trigger thumb.  Improving with splinting and ice.  Continue.  If pain returns or if the clicking and inability to fully extend impairs function or bothers him enough in a month or so he will return for injection.  #2 adult ADD, doing well on methylphenidate CR 36 mg daily.  Refilled #90 today.  #3 GAD, recurrent depression.  In remission. Doing well long-term on Wellbutrin XL 300 mg a day.  An After Visit Summary was printed and given to the patient.  FOLLOW UP: No follow-ups on file.  Signed:  [name], MD           03/29/2023

## 2023-04-01 ENCOUNTER — Ambulatory Visit: Payer: 59 | Admitting: Family Medicine

## 2023-04-08 NOTE — Telephone Encounter (Signed)
 Letter sent to patient via MyChart.

## 2023-04-08 NOTE — Telephone Encounter (Signed)
Nothing further needed at this time. 

## 2023-05-02 ENCOUNTER — Other Ambulatory Visit: Payer: Self-pay | Admitting: Family Medicine

## 2023-07-15 ENCOUNTER — Other Ambulatory Visit: Payer: Self-pay | Admitting: Family Medicine

## 2023-07-15 MED ORDER — METHYLPHENIDATE HCL ER (OSM) 36 MG PO TBCR: 36.0000 mg | EXTENDED_RELEASE_TABLET | Freq: Every day | ORAL | 0 refills | Status: DC

## 2023-07-15 NOTE — Telephone Encounter (Signed)
 Copied from CRM [id number]. Topic: Clinical - Medication Refill >> Jul 15, 2023  9:42 AM Corin V wrote: Medication: methylphenidate  36 MG PO CR tablet  Has the patient contacted their pharmacy? Yes (Agent: If no, request that the patient contact the pharmacy for the refill. If patient does not wish to contact the pharmacy document the reason why and proceed with request.) (Agent: If yes, when and what did the pharmacy advise?)  This is the patient's preferred pharmacy:  [id number] [place], [place] - [address] [place] [address] Phone: [phone] Fax: [phone]   Is this the correct pharmacy for this prescription? Yes If no, delete pharmacy and type the correct one.   Has the prescription been filled recently? No  Is the patient out of the medication? No- has 1 dose for tomorrow left  Has the patient been seen for an appointment in the last year OR does the patient have an upcoming appointment? Yes- scheduled for first available next Monday, needs bridge dose  Can we respond through MyChart? Yes  Agent: Please be advised that Rx refills may take up to 3 business days. We ask that you follow-up with your pharmacy.

## 2023-07-19 ENCOUNTER — Ambulatory Visit: Payer: Self-pay | Admitting: Family Medicine

## 2023-07-19 VITALS — BP 140/76 | HR 93 | Ht 69.0 in | Wt 358.2 lb

## 2023-07-19 DIAGNOSIS — Z79899 Other long term (current) drug therapy: Secondary | ICD-10-CM | POA: Diagnosis not present

## 2023-07-19 DIAGNOSIS — L649 Androgenic alopecia, unspecified: Secondary | ICD-10-CM

## 2023-07-19 DIAGNOSIS — F411 Generalized anxiety disorder: Secondary | ICD-10-CM | POA: Diagnosis not present

## 2023-07-19 DIAGNOSIS — F3340 Major depressive disorder, recurrent, in remission, unspecified: Secondary | ICD-10-CM

## 2023-07-19 DIAGNOSIS — F988 Other specified behavioral and emotional disorders with onset usually occurring in childhood and adolescence: Secondary | ICD-10-CM | POA: Diagnosis not present

## 2023-07-19 MED ORDER — METHYLPHENIDATE HCL ER (OSM) 36 MG PO TBCR: 36.0000 mg | EXTENDED_RELEASE_TABLET | Freq: Every day | ORAL | 0 refills | Status: DC

## 2023-07-19 MED ORDER — MINOXIDIL 2.5 MG PO TABS: 2.5000 mg | ORAL_TABLET | Freq: Every day | ORAL | 1 refills | Status: DC

## 2023-07-19 NOTE — Progress Notes (Signed)
 OFFICE VISIT  07/19/2023  CC: No chief complaint on file.   Patient is a 38 y.o. male who presents for 10-month follow-up adult ADD, GAD and depression. A/P as of last visit: #1 left trigger thumb.  Improving with splinting and ice.  Continue.  If pain returns or if the clicking and inability to fully extend impairs function or bothers him enough in a month or so he will return for injection.   #2 adult ADD, doing well on methylphenidate  CR 36 mg daily.  Refilled #90 today.   #3 GAD, recurrent depression.  In remission. Doing well long-term on Wellbutrin  XL 300 mg a day.  INTERIM HX: Cesar Young feels well. He got a promotion at [employer].  Pt states all is going well with the med at current dosing: much improved focus, concentration, task completion.  Less frustration, better multitasking, less impulsivity and restlessness.  Mood is stable. No side effects from the medication.  Anxiety level and mood are good on Wellbutrin .  He has been using over-the-counter minoxidil topical medication for male pattern hair loss. He feels like it has improved a little bit but he cannot remember to do it very often and he requests the oral form today.  PMP AWARE reviewed today: most recent rx for methylphenidate  ER 36 mg was filled 07/15/2023, # 90, rx by me. No red flags.   Past Medical History:  Diagnosis Date   Adult ADHD    Allergic rhinitis    Anxiety and depression    resp well to wellbutrin    Asthma childhood   No albuterol  requirement in years, then acute flare 02/2012   COVID-19 virus infection 07/2020   ED (erectile dysfunction) 2014   Herpes zoster 11/2010   Hypertriglyceridemia    Morbid obesity (HCC)    Plantar fasciitis    [employer] orthotics helping   Thyroid  nodule summer 2019   u/s -->2.1 cm-->bx neg.  Has f/u 03/2018.   Tobacco abuse 12/28/2011   Quit smoking 2019, then took up dipping a lot.    Past Surgical History:  Procedure Laterality Date   ADENOIDECTOMY   1993   BIOPSY THYROID   08/2017   NEG for malignancy   WISDOM TOOTH EXTRACTION  2009    Outpatient Medications Prior to Visit  Medication Sig Dispense Refill   albuterol  (VENTOLIN  HFA) 108 (90 Base) MCG/ACT inhaler Inhale 2 puffs into the lungs every 6 (six) hours as needed for wheezing or shortness of breath. 8 g 1   buPROPion  (WELLBUTRIN  XL) 300 MG 24 hr tablet TAKE 1 TABLET BY MOUTH EVERY DAY 90 tablet 1   Famotidine-Ca Carb-Mag Hydrox (PEPCID COMPLETE PO) Take by mouth.     Omeprazole  Magnesium (PRILOSEC OTC PO) Take by mouth daily.     tadalafil (CIALIS) 20 MG tablet Take 20 mg by mouth as needed.     azelastine  (ASTELIN ) 0.1 % nasal spray Place 1 spray into both nostrils 2 (two) times daily. Use in each nostril as directed (Patient not taking: Reported on 07/19/2023) 30 mL 5   methylphenidate  36 MG PO CR tablet Take 1 tablet (36 mg total) by mouth daily. 90 tablet 0   No facility-administered medications prior to visit.    Allergies  Allergen Reactions   Cephalosporins    Penicillins     Review of Systems As per HPI  PE:    07/19/2023    2:43 PM 03/29/2023    2:44 PM 03/20/2023    1:18 PM  Vitals with BMI  Height 5' 9    Weight 358 lbs 3 oz 363 lbs 3 oz 359 lbs 13 oz  BMI 52.87    Systolic 140 127 890  Diastolic 76 73 69  Pulse 93 92 86    Physical Exam  General: Alert and well-appearing. Affect: Pleasant.  Lucid thought and speech. Cardiovascular: Regular rhythm and rate without murmur rub or gallop. Lungs are clear bilaterally. Extremities show no edema. Scalp with sparse hair thinning on top and temples.  LABS:  Last CBC Lab Results  Component Value Date   WBC 6.4 10/03/2022   HGB 15.1 10/03/2022   HCT 45.4 10/03/2022   MCV 90.0 10/03/2022   MCH 29.3 02/13/2022   RDW 13.3 10/03/2022   PLT 247.0 10/03/2022   Last metabolic panel Lab Results  Component Value Date   GLUCOSE 93 10/03/2022   NA 138 10/03/2022   K 4.0 10/03/2022   CL 102 10/03/2022    CO2 28 10/03/2022   BUN 9 10/03/2022   CREATININE 0.96 10/03/2022   GFR 102.10 10/03/2022   CALCIUM 10.0 10/03/2022   PROT 7.2 10/03/2022   ALBUMIN 4.2 10/03/2022   BILITOT 0.4 10/03/2022   ALKPHOS 65 10/03/2022   AST 25 10/03/2022   ALT 42 10/03/2022   ANIONGAP 13 02/13/2022   Last lipids Lab Results  Component Value Date   CHOL 193 10/03/2022   HDL 55.00 10/03/2022   LDLCALC 80 10/03/2022   TRIG 290.0 (H) 10/03/2022   CHOLHDL 4 10/03/2022   Last hemoglobin A1c Lab Results  Component Value Date   HGBA1C 5.8 10/03/2022   Last thyroid  functions Lab Results  Component Value Date   TSH 1.82 10/03/2022   IMPRESSION AND PLAN:  #1 adult ADD, doing well on methylphenidate  CR 36 mg daily.  Refilled #90 today. Controlled substance contract up-to-date. Urine drug screen today.  #2 GAD, recurrent depression.  In remission. Doing well long-term on Wellbutrin  XL 300 mg a day  #3 male pattern hair loss. Minoxidil 2.5 mg tabs, 1 tab daily.  Consider increase to 5 mg daily at follow-up in 3 months.  An After Visit Summary was printed and given to the patient.  FOLLOW UP: Return in about 3 months (around 10/19/2023) for routine chronic illness f/u.  Signed:  Gerlene Hockey, MD           07/19/2023

## 2023-07-21 LAB — DRUG MONITORING PANEL 376104, URINE
Amphetamines: NEGATIVE ng/mL (ref <500)
Barbiturates: NEGATIVE ng/mL (ref <300)
Benzodiazepines: NEGATIVE ng/mL (ref <100)
Cocaine Metabolite: NEGATIVE ng/mL (ref <150)
Desmethyltramadol: NEGATIVE ng/mL (ref <100)
Opiates: NEGATIVE ng/mL (ref <100)
Oxycodone: NEGATIVE ng/mL (ref <100)
Tramadol: NEGATIVE ng/mL (ref <100)

## 2023-07-21 LAB — DM TEMPLATE

## 2023-07-22 ENCOUNTER — Ambulatory Visit: Payer: Self-pay | Admitting: Family Medicine

## 2023-09-30 ENCOUNTER — Encounter: Payer: Self-pay | Admitting: Family Medicine

## 2023-09-30 ENCOUNTER — Telehealth: Admitting: Family Medicine

## 2023-09-30 DIAGNOSIS — K047 Periapical abscess without sinus: Secondary | ICD-10-CM | POA: Diagnosis not present

## 2023-09-30 MED ORDER — CLINDAMYCIN HCL 300 MG PO CAPS: 300.0000 mg | ORAL_CAPSULE | Freq: Three times a day (TID) | ORAL | 0 refills | Status: AC

## 2023-09-30 NOTE — Progress Notes (Signed)
 Virtual Visit via Video Note  I connected with Cesar Young  on 09/30/23 at  8:20 AM EDT by a video enabled telemedicine application and verified that I am speaking with the correct person using two identifiers.  Location patient: [place] Location provider:work or home office Persons participating in the virtual visit: patient, provider  I discussed the limitations and requested verbal permission for telemedicine visit. The patient expressed understanding and agreed to proceed.  HPI: 38 year old male being seen today for tooth pain. Focal pain in left lower canine for about a week, acutely worsened in the last 24 hours.  Mild swelling in the gingiva focally around the tooth.  No jaw swelling.  No fever.    ROS: See pertinent positives and negatives per HPI.  Past Medical History:  Diagnosis Date   Adult ADHD    Allergic rhinitis    Anxiety and depression    resp well to wellbutrin    Asthma childhood   No albuterol  requirement in years, then acute flare 02/2012   COVID-19 virus infection 07/2020   ED (erectile dysfunction) 2014   Herpes zoster 11/2010   Hypertriglyceridemia    Morbid obesity (HCC)    Plantar fasciitis    [employer] orthotics helping   Thyroid  nodule summer 2019   u/s -->2.1 cm-->bx neg.  Has f/u 03/2018.   Tobacco abuse 12/28/2011   Quit smoking 2019, then took up dipping a lot.    Past Surgical History:  Procedure Laterality Date   ADENOIDECTOMY  1993   BIOPSY THYROID   08/2017   NEG for malignancy   WISDOM TOOTH EXTRACTION  2009     Current Outpatient Medications:    albuterol  (VENTOLIN  HFA) 108 (90 Base) MCG/ACT inhaler, Inhale 2 puffs into the lungs every 6 (six) hours as needed for wheezing or shortness of breath., Disp: 8 g, Rfl: 1   buPROPion  (WELLBUTRIN  XL) 300 MG 24 hr tablet, TAKE 1 TABLET BY MOUTH EVERY DAY, Disp: 90 tablet, Rfl: 1   Famotidine-Ca Carb-Mag Hydrox (PEPCID COMPLETE PO), Take by mouth., Disp: , Rfl:    methylphenidate  36 MG PO CR tablet,  Take 1 tablet (36 mg total) by mouth daily., Disp: 90 tablet, Rfl: 0   minoxidil  (LONITEN ) 2.5 MG tablet, Take 1 tablet (2.5 mg total) by mouth daily., Disp: 90 tablet, Rfl: 1   Omeprazole  Magnesium (PRILOSEC OTC PO), Take by mouth daily., Disp: , Rfl:    tadalafil (CIALIS) 20 MG tablet, Take 20 mg by mouth as needed., Disp: , Rfl:    azelastine  (ASTELIN ) 0.1 % nasal spray, Place 1 spray into both nostrils 2 (two) times daily. Use in each nostril as directed (Patient not taking: Reported on 09/30/2023), Disp: 30 mL, Rfl: 5  EXAM:  VITALS per patient if applicable:      07/19/2023    2:43 PM 03/29/2023    2:44 PM 03/20/2023    1:18 PM  Vitals with BMI  Height 5' 9    Weight 358 lbs 3 oz 363 lbs 3 oz 359 lbs 13 oz  BMI 52.87    Systolic 140 127 890  Diastolic 76 73 69  Pulse 93 92 86     GENERAL: alert, oriented, appears well and in no acute distress  He is missing one of his left lower incisors. The canine adjacent to this has a visible erosion on the anterior surface abutting the gingiva.  Minimal surrounding erythema of the gingiva.  No swelling.  HEENT: atraumatic, conjunttiva clear, no obvious abnormalities on inspection  of external nose and ears  NECK: normal movements of the head and neck  LUNGS: on inspection no signs of respiratory distress, breathing rate appears normal, no obvious gross SOB, gasping or wheezing  CV: no obvious cyanosis  MS: moves all visible extremities without noticeable abnormality  PSYCH/NEURO: pleasant and cooperative, no obvious depression or anxiety, speech and thought processing grossly intact  LABS: none today  ASSESSMENT AND PLAN:  Discussed the following assessment and plan:  #1 tooth infection.  Likely small abscess. Start clindamycin  300 mg 3 times daily x 7 days. He is in the process of setting up a visit with his dentist. He can take 600 to 800 mg ibuprofen twice daily as needed with food.  I discussed the assessment and treatment  plan with the patient. The patient was provided an opportunity to ask questions and all were answered. The patient agreed with the plan and demonstrated an understanding of the instructions.   F/u: If not significantly improving in 4 to 5 days  Signed:  Gerlene Hockey, MD           09/30/2023

## 2023-10-18 ENCOUNTER — Ambulatory Visit: Payer: Self-pay | Admitting: *Deleted

## 2023-10-18 ENCOUNTER — Other Ambulatory Visit: Payer: Self-pay | Admitting: Family Medicine

## 2023-10-18 MED ORDER — METHYLPHENIDATE HCL ER (OSM) 36 MG PO TBCR: 36.0000 mg | EXTENDED_RELEASE_TABLET | Freq: Every day | ORAL | 0 refills | Status: AC

## 2023-10-18 NOTE — Telephone Encounter (Signed)
 Refill requested for Methylphenidate  [facility] [address]  Last OV 07/19/23  Next OV 10/21/23

## 2023-10-18 NOTE — Telephone Encounter (Signed)
 FYI Only or Action Required?: Action required by provider: medication refill request and feels fatigue from missing dose of medication this am..  Patient was last seen in primary care on 09/30/2023 by McGowen, Aleene DEL, MD.  Called Nurse Triage reporting Fatigue.  Symptoms began today.  Interventions attempted: Prescription medications: na  and Rest, hydration, or home remedies.  Symptoms are: gradually worsening.  Triage Disposition: Home Care  Patient/caregiver understands and will follow disposition?: No, wishes to speak with PCP     Requesting refill of methylphenidate  36 mg po CR today . Ran out yesterday .        Copied from CRM [id number]. Topic: Clinical - Red Word Triage >> Oct 18, 2023  8:05 AM Timindy P wrote: [facility] that prompted transfer to Nurse Triage: Patient called to refill ADHD medication however mentions he is experiencing fatigue since he is out of medication   ----------------------------------------------------------------------- From previous Reason for Contact - Medication Refill: Medication:   Has the patient contacted their pharmacy?   (Agent: If no, request that the patient contact the pharmacy for the refill. If patient does not wish to contact the pharmacy document the reason why and proceed with request.) (Agent: If yes, when and what did the pharmacy advise?)  This is the patient's preferred pharmacy:  [id number] [address], [place] - [address] [place] [address] Phone: [phone] Fax: [phone]  [facility] [address], [address] [place] [address] Phone: [phone] Fax: [phone]  Is this the correct pharmacy for this prescription?   If no, delete pharmacy and type the correct one.   Has the prescription been filled recently?    Is the patient out of the medication?    Has the patient been seen for an appointment in the last year OR  does the patient have an upcoming appointment?    Can we respond through MyChart?    Agent: Please be advised that Rx refills may take up to 3 business days. We ask that you follow-up with your pharmacy. Reason for Disposition  Poor fluid intake probably caused the weakness  Answer Assessment - Initial Assessment Questions Requesting methylphenidate  36 mg po CR refill. Ran out of medication yesterday and started feeling droggy 2 hours ago. Is able to work but requesting refill.       1. DESCRIPTION: Describe how you are feeling.     Droggy since not taking medication this am 2. SEVERITY: How bad is it?  Can you stand and walk?     Able to work but droggy can stand and walk 3. ONSET: When did these symptoms begin? (e.g., hours, days, weeks, months)     2 hours ago  4. CAUSE: What do you think is causing the weakness or fatigue? (e.g., not drinking enough fluids, medical problem, trouble sleeping)     Ran out of medication  5. NEW MEDICINES:  Have you started on any new medicines recently? (e.g., opioid pain medicines, benzodiazepines, muscle relaxants, antidepressants, antihistamines, neuroleptics, beta blockers)     No but take methylphenidate  6. OTHER SYMPTOMS: Do you have any other symptoms? (e.g., chest pain, fever, cough, SOB, vomiting, diarrhea, bleeding, other areas of pain)     Feels droggy with out medication 7. PREGNANCY: Is there any chance you are pregnant? When was your last menstrual period?     na  Protocols used: Weakness (Generalized) and Fatigue-A-AH

## 2023-10-21 ENCOUNTER — Ambulatory Visit: Admitting: Family Medicine

## 2023-10-23 ENCOUNTER — Encounter: Payer: Self-pay | Admitting: Family Medicine

## 2023-10-23 ENCOUNTER — Ambulatory Visit: Admitting: Family Medicine

## 2023-10-23 VITALS — BP 136/74 | HR 84 | Temp 98.3°F | Ht 69.0 in | Wt 368.8 lb

## 2023-10-23 DIAGNOSIS — F509 Eating disorder, unspecified: Secondary | ICD-10-CM

## 2023-10-23 DIAGNOSIS — F3342 Major depressive disorder, recurrent, in full remission: Secondary | ICD-10-CM | POA: Diagnosis not present

## 2023-10-23 DIAGNOSIS — F411 Generalized anxiety disorder: Secondary | ICD-10-CM

## 2023-10-23 DIAGNOSIS — Z6841 Body Mass Index (BMI) 40.0 and over, adult: Secondary | ICD-10-CM

## 2023-10-23 MED ORDER — BUPROPION HCL ER (XL) 300 MG PO TB24: 300.0000 mg | ORAL_TABLET | Freq: Every day | ORAL | 3 refills | Status: AC

## 2023-10-23 NOTE — Progress Notes (Signed)
 OFFICE VISIT  10/23/2023  CC: No chief complaint on file.   Patient is a 38 y.o. male who presents for 26-month follow-up adult ADD, GAD, and depression. A/P as of last visit: #1 adult ADD, doing well on methylphenidate  CR 36 mg daily.  Refilled #90 today. Controlled substance contract up-to-date. Urine drug screen today.   #2 GAD, recurrent depression.  In remission. Doing well long-term on Wellbutrin  XL 300 mg a day   #3 male pattern hair loss. Minoxidil  2.5 mg tabs, 1 tab daily.  Consider increase to 5 mg daily at follow-up in 3 months.  INTERIM HX: Cesar Young is doing well. Anxiety level and mood are good.  He does struggle with overeating as a coping mechanism for general life stress.  He also admits that he just enjoys eating food in general and sometimes will eat more when he is happy and not particularly stressed. He is interested in getting some counseling about this. He denies any binge/purge behavior. Excessive soda intake is his worst dietary habit.  He was wary of the side effects of minoxidil  so he never picked up the prescription.  PMP AWARE reviewed today: most recent rx for methylphenidate  ER 36 mg was filled 10/18/2023, # 90, rx by me. No red flags.  ROS --> no fevers, no CP, no SOB, no wheezing, no cough, no dizziness, no HAs, no rashes, no melena/hematochezia.  No polyuria or polydipsia.  No myalgias or arthralgias.  No focal weakness, paresthesias, or tremors.  No acute vision or hearing abnormalities.  No dysuria or unusual/new urinary urgency or frequency.  No recent changes in lower legs. No n/v/d or abd pain.  No palpitations.     Past Medical History:  Diagnosis Date   Adult ADHD    Allergic rhinitis    Anxiety and depression    resp well to wellbutrin    Asthma childhood   No albuterol  requirement in years, then acute flare 02/2012   COVID-19 virus infection 07/2020   ED (erectile dysfunction) 2014   Herpes zoster 11/2010   Hypertriglyceridemia     Morbid obesity (HCC)    Plantar fasciitis    [employer] orthotics helping   Thyroid  nodule summer 2019   u/s -->2.1 cm-->bx neg.  Has f/u 03/2018.   Tobacco abuse 12/28/2011   Quit smoking 2019, then took up dipping a lot.    Past Surgical History:  Procedure Laterality Date   ADENOIDECTOMY  1993   BIOPSY THYROID   08/2017   NEG for malignancy   WISDOM TOOTH EXTRACTION  2009    Outpatient Medications Prior to Visit  Medication Sig Dispense Refill   albuterol  (VENTOLIN  HFA) 108 (90 Base) MCG/ACT inhaler Inhale 2 puffs into the lungs every 6 (six) hours as needed for wheezing or shortness of breath. 8 g 1   methylphenidate  36 MG PO CR tablet Take 1 tablet (36 mg total) by mouth daily. 90 tablet 0   Famotidine-Ca Carb-Mag Hydrox (PEPCID COMPLETE PO) Take by mouth.     tadalafil (CIALIS) 20 MG tablet Take 20 mg by mouth as needed. (Patient not taking: Reported on 10/23/2023)     azelastine  (ASTELIN ) 0.1 % nasal spray Place 1 spray into both nostrils 2 (two) times daily. Use in each nostril as directed (Patient not taking: Reported on 10/23/2023) 30 mL 5   buPROPion  (WELLBUTRIN  XL) 300 MG 24 hr tablet TAKE 1 TABLET BY MOUTH EVERY DAY 90 tablet 1   minoxidil  (LONITEN ) 2.5 MG tablet Take 1 tablet (2.5 mg  total) by mouth daily. (Patient not taking: Reported on 10/23/2023) 90 tablet 1   Omeprazole  Magnesium (PRILOSEC OTC PO) Take by mouth daily. (Patient not taking: Reported on 10/23/2023)     No facility-administered medications prior to visit.    Allergies  Allergen Reactions   Cephalosporins    Penicillins     Review of Systems As per HPI  PE:    10/23/2023    2:36 PM 07/19/2023    2:43 PM 03/29/2023    2:44 PM  Vitals with BMI  Height 5' 9 5' 9   Weight 368 lbs 13 oz 358 lbs 3 oz 363 lbs 3 oz  BMI 54.44 52.87   Systolic 136 140 872  Diastolic 74 76 73  Pulse 84 93 92     Physical Exam  Gen: Alert, well appearing.  Patient is oriented to person, place, time, and  situation. AFFECT: pleasant, lucid thought and speech. No further exam today  LABS:  Last CBC Lab Results  Component Value Date   WBC 6.4 10/03/2022   HGB 15.1 10/03/2022   HCT 45.4 10/03/2022   MCV 90.0 10/03/2022   MCH 29.3 02/13/2022   RDW 13.3 10/03/2022   PLT 247.0 10/03/2022   Last metabolic panel Lab Results  Component Value Date   GLUCOSE 93 10/03/2022   NA 138 10/03/2022   K 4.0 10/03/2022   CL 102 10/03/2022   CO2 28 10/03/2022   BUN 9 10/03/2022   CREATININE 0.96 10/03/2022   GFR 102.10 10/03/2022   CALCIUM 10.0 10/03/2022   PROT 7.2 10/03/2022   ALBUMIN 4.2 10/03/2022   BILITOT 0.4 10/03/2022   ALKPHOS 65 10/03/2022   AST 25 10/03/2022   ALT 42 10/03/2022   ANIONGAP 13 02/13/2022   Last lipids Lab Results  Component Value Date   CHOL 193 10/03/2022   HDL 55.00 10/03/2022   LDLCALC 80 10/03/2022   TRIG 290.0 (H) 10/03/2022   CHOLHDL 4 10/03/2022   Last hemoglobin A1c Lab Results  Component Value Date   HGBA1C 5.8 10/03/2022   Last thyroid  functions Lab Results  Component Value Date   TSH 1.82 10/03/2022   IMPRESSION AND PLAN:  #1 adult ADD, doing well on methylphenidate  CR 36 mg daily.  Controlled substance contract and urine drug screen are up-to-date. A refill was not needed today.   #2 GAD, recurrent depression.  In remission. Doing well long-term on Wellbutrin  XL 300 mg a day  #3 morbid obesity, eating disorder. Refer to behavioral health for counseling.  An After Visit Summary was printed and given to the patient.  FOLLOW UP: Return in about 3 months (around 01/23/2024) for routine chronic illness f/u.  Signed:  Gerlene Hockey, MD           10/23/2023

## 2023-12-20 ENCOUNTER — Ambulatory Visit
Admission: EM | Admit: 2023-12-20 | Discharge: 2023-12-20 | Disposition: A | Discharge status: Home / Self Care | Attending: Internal Medicine | Admitting: Internal Medicine

## 2023-12-20 DIAGNOSIS — K0889 Other specified disorders of teeth and supporting structures: Secondary | ICD-10-CM

## 2023-12-20 DIAGNOSIS — K047 Periapical abscess without sinus: Secondary | ICD-10-CM | POA: Diagnosis not present

## 2023-12-20 MED ORDER — CLINDAMYCIN HCL 150 MG PO CAPS: 450.0000 mg | ORAL_CAPSULE | Freq: Three times a day (TID) | ORAL | 0 refills | Status: AC

## 2023-12-20 NOTE — ED Triage Notes (Signed)
 Pr c/o dental pain since last night. Has appt on Wed to have tooth pulled, bottom LT. Ibuprofen and tylenol  prn.

## 2023-12-20 NOTE — ED Provider Notes (Signed)
 [facility]    CSN: [id number] Arrival date & time: 12/20/23  0856      History   Chief Complaint Chief Complaint  Patient presents with   Dental Pain    HPI Cesar Young is a 39 y.o. male.   Patient presents with left lower dental pain that started last night.  He has had previous infections in this area of his mouth with the last infection being approximately 2 months ago.  He states that he has a previously scheduled appointment with his dentist in about 6 days to have the tooth pulled. He also contacted their on call service to notify them of symptoms.  He has been taking ibuprofen and Tylenol  as needed.  Patient's blood pressure is elevated but he is attributing this to pain as his blood pressure is not typically this high.  He is not reporting any headache, dizziness, chest pain, shortness of breath, blurred vision.   Dental Pain   Past Medical History:  Diagnosis Date   Adult ADHD    Allergic rhinitis    Anxiety and depression    resp well to wellbutrin    Asthma childhood   No albuterol  requirement in years, then acute flare 02/2012   COVID-19 virus infection 07/2020   ED (erectile dysfunction) 2014   Herpes zoster 11/2010   Hypertriglyceridemia    Morbid obesity (HCC)    Plantar fasciitis    [employer] orthotics helping   Thyroid  nodule summer 2019   u/s -->2.1 cm-->bx neg.  Has f/u 03/2018.   Tobacco abuse 12/28/2011   Quit smoking 2019, then took up dipping a lot.    Patient Active Problem List   Diagnosis Date Noted   Patellofemoral pain syndrome of right knee 06/12/2018   Acute bronchitis 10/29/2012   Viral syndrome 10/29/2012   Health maintenance examination 10/23/2012   Erectile dysfunction 10/23/2012   Tobacco dependence 10/23/2012    Past Surgical History:  Procedure Laterality Date   ADENOIDECTOMY  1993   BIOPSY THYROID   08/2017   NEG for malignancy   WISDOM TOOTH EXTRACTION  2009       Home Medications    Prior to  Admission medications   Medication Sig Start Date End Date Taking? Authorizing Provider  clindamycin  (CLEOCIN ) 150 MG capsule Take 3 capsules (450 mg total) by mouth 3 (three) times daily for 5 days. 12/20/23 12/25/23 Yes Navie Lamoreaux, Darryle BRAVO, FNP  albuterol  (VENTOLIN  HFA) 108 (90 Base) MCG/ACT inhaler Inhale 2 puffs into the lungs every 6 (six) hours as needed for wheezing or shortness of breath. 09/04/22   McGowen, Aleene DEL, MD  buPROPion  (WELLBUTRIN  XL) 300 MG 24 hr tablet Take 1 tablet (300 mg total) by mouth daily. 10/23/23   McGowen, Aleene DEL, MD  methylphenidate  36 MG PO CR tablet Take 1 tablet (36 mg total) by mouth daily. 10/18/23   McGowen, Aleene DEL, MD  tadalafil (CIALIS) 20 MG tablet Take 20 mg by mouth as needed. Patient not taking: Reported on 10/23/2023 01/27/22   [provider]    Family History Family History  Problem Relation Age of Onset   GER disease Mother    Cancer Father        thyroid  cancer (non-familial type   Depression Father     Social History Social History   Tobacco Use   Smoking status: Former    Current packs/day: 0.00    Average packs/day: 0.3 packs/day for 9.0 years (2.3 ttl pk-yrs)    Types:  Cigarettes    Start date: 01/04/2009    Quit date: 01/04/2018    Years since quitting: 5.9   Smokeless tobacco: Current    Types: Snuff  Vaping Use   Vaping status: Never Used  Substance Use Topics   Alcohol use: Yes    Comment: occasionally 1 or 2 beers every couple months   Drug use: No     Allergies   Cephalosporins and Penicillins   Review of Systems Review of Systems Per HPI  Physical Exam Triage Vital Signs ED Triage Vitals  Encounter Vitals Group     BP 12/20/23 0951 (!) 178/118     Girls Systolic BP Percentile --      Girls Diastolic BP Percentile --      Boys Systolic BP Percentile --      Boys Diastolic BP Percentile --      Pulse Rate 12/20/23 0947 78     Resp 12/20/23 0947 18     Temp 12/20/23 0947 (!) 97.4 F (36.3 C)      Temp Source 12/20/23 0947 Oral     SpO2 12/20/23 0947 97 %     Weight --      Height --      Head Circumference --      Peak Flow --      Pain Score 12/20/23 0950 9     Pain Loc --      Pain Education --      Exclude from Growth Chart --    No data found.  Updated Vital Signs BP (!) 178/98 (BP Location: Right Arm)   Pulse 78   Temp (!) 97.4 F (36.3 C) (Oral)   Resp 18   SpO2 97%   Visual Acuity Right Eye Distance:   Left Eye Distance:   Bilateral Distance:    Right Eye Near:   Left Eye Near:    Bilateral Near:     Physical Exam Constitutional:      General: He is not in acute distress.    Appearance: Normal appearance. He is not toxic-appearing or diaphoretic.  HENT:     Head: Normocephalic and atraumatic.     Mouth/Throat:     Dentition: Abnormal dentition. Dental tenderness and gingival swelling present.      Comments: Gingival erythema and mild swelling noted to left lower dentition.  No visible abscess noted. Eyes:     Extraocular Movements: Extraocular movements intact.     Conjunctiva/sclera: Conjunctivae normal.  Pulmonary:     Effort: Pulmonary effort is normal.  Neurological:     General: No focal deficit present.     Mental Status: He is alert and oriented to person, place, and time. Mental status is at baseline.  Psychiatric:        Mood and Affect: Mood normal.        Behavior: Behavior normal.        Thought Content: Thought content normal.        Judgment: Judgment normal.      UC Treatments / Results  Labs (all labs ordered are listed, but only abnormal results are displayed) Labs Reviewed - No data to display  EKG   Radiology No results found.  Procedures Procedures (including critical care time)  Medications Ordered in UC Medications - No data to display  Initial Impression / Assessment and Plan / UC Course  I have reviewed the triage vital signs and the nursing notes.  Pertinent labs & imaging results that were  available  during my care of the patient were reviewed by me and considered in my medical decision making (see chart for details).     Will treat dental infection with clindamycin  given patient has an allergy to penicillin.  Advised patient to take this medication with food.  Advised patient to continue follow-up with dentist for further evaluation and management.  Blood pressure is elevated but suspect this is due to pain as he is not exhibiting any signs of hypertensive urgency.  Encouraged patient to monitor blood pressure at home with home blood pressure cuff and follow-up if it remains elevated.  Patient verbalized understanding and was agreeable with plan. Final Clinical Impressions(s) / UC Diagnoses   Final diagnoses:  Pain, dental  Dental infection     Discharge Instructions      I have prescribed you an antibiotic for dental infection.  Please follow-up with dentist as soon as possible.    ED Prescriptions     Medication Sig Dispense Auth. Provider   clindamycin  (CLEOCIN ) 150 MG capsule Take 3 capsules (450 mg total) by mouth 3 (three) times daily for 5 days. 45 capsule [place], [place], [place]      I have reviewed the PDMP during this encounter.   Hazen Darryle BRAVO, [place] 12/20/23 1022

## 2023-12-20 NOTE — Discharge Instructions (Signed)
 I have prescribed you an antibiotic for dental infection.  Please follow-up with dentist as soon as possible.

## 2023-12-30 ENCOUNTER — Ambulatory Visit: Admitting: Family Medicine

## 2023-12-30 VITALS — BP 146/79 | HR 100 | Temp 98.3°F | Ht 69.0 in | Wt 379.4 lb

## 2023-12-30 DIAGNOSIS — I10 Essential (primary) hypertension: Secondary | ICD-10-CM | POA: Diagnosis not present

## 2023-12-30 DIAGNOSIS — G4733 Obstructive sleep apnea (adult) (pediatric): Secondary | ICD-10-CM

## 2023-12-30 DIAGNOSIS — G4719 Other hypersomnia: Secondary | ICD-10-CM | POA: Diagnosis not present

## 2023-12-30 MED ORDER — AMLODIPINE BESY-BENAZEPRIL HCL 5-10 MG PO CAPS: 1.0000 | ORAL_CAPSULE | Freq: Every day | ORAL | 0 refills | Status: DC

## 2023-12-30 NOTE — Patient Instructions (Addendum)
 Take one of the 5-10 amlodipine -benazepril  tabs every morning. Check your blood pressure and heart rate every day. If your blood pressure is still greater than 130/80 in 7 days then start taking 2 tabs every morning.

## 2023-12-30 NOTE — Progress Notes (Unsigned)
 OFFICE VISIT  12/30/2023  CC:  Chief Complaint  Patient presents with   Medical Management of Chronic Issues    Patient is a 38 y.o. male who presents for elevated blood pressure.  HPI: ***  Past Medical History:  Diagnosis Date   Adult ADHD    Allergic rhinitis    Allergy 12/91   Anxiety and depression    resp well to wellbutrin    Asthma childhood   No albuterol  requirement in years, then acute flare 02/2012   COVID-19 virus infection 07/2020   ED (erectile dysfunction) 2014   GERD (gastroesophageal reflux disease) 12/12   Herpes zoster 11/2010   Hypertriglyceridemia    Morbid obesity (HCC)    Plantar fasciitis    [employer] orth[facility] helping   Thyroid  nodule summer 2019   u/s -->2.1 cm-->bx neg.  Has f/u 03/2018.   Tobacco abuse 12/28/2011   Quit smoking 2019, then took up dipping a lot.    Past Surgical History:  Procedure Laterality Date   ADENOIDECTOMY  1993   BIOPSY THYROID   08/2017   NEG for malignancy   WISDOM TOOTH EXTRACTION  2009    Outpatient Medications Prior to Visit  Medication Sig Dispense Refill   albuterol  (VENTOLIN  HFA) 108 (90 Base) MCG/ACT inhaler Inhale 2 puffs into the lungs every 6 (six) hours as needed for wheezing or shortness of breath. 8 g 1   buPROPion  (WELLBUTRIN  XL) 300 MG 24 hr tablet Take 1 tablet (300 mg total) by mouth daily. 90 tablet 3   doxycycline  (VIBRAMYCIN ) 100 MG capsule Take 100 mg by mouth 2 (two) times daily.     methylphenidate  36 MG PO CR tablet Take 1 tablet (36 mg total) by mouth daily. 90 tablet 0   tadalafil (CIALIS) 20 MG tablet Take 20 mg by mouth as needed. (Patient not taking: Reported on 12/30/2023)     No facility-administered medications prior to visit.    Allergies  Allergen Reactions   Cephalosporins    Penicillins     Review of Systems  As per HPI  PE:    12/30/2023    2:37 PM 12/30/2023    2:32 PM 12/20/2023   10:03 AM  Vitals with BMI  Height  5' 9   Weight  379 lbs 6 oz   BMI  56    Systolic 146 146 821  Diastolic 79 84 98  Pulse  100      Physical Exam  ***  LABS:  Last metabolic panel Lab Results  Component Value Date   GLUCOSE 93 10/03/2022   NA 138 10/03/2022   K 4.0 10/03/2022   CL 102 10/03/2022   CO2 28 10/03/2022   BUN 9 10/03/2022   CREATININE 0.96 10/03/2022   GFR 102.10 10/03/2022   CALCIUM 10.0 10/03/2022   PROT 7.2 10/03/2022   ALBUMIN 4.2 10/03/2022   BILITOT 0.4 10/03/2022   ALKPHOS 65 10/03/2022   AST 25 10/03/2022   ALT 42 10/03/2022   ANIONGAP 13 02/13/2022   Last thyroid  functions Lab Results  Component Value Date   TSH 1.82 10/03/2022   IMPRESSION AND PLAN:  No problem-specific Assessment & Plan notes found for this encounter.   An After Visit Summary was printed and given to the patient.  FOLLOW UP: No follow-ups on file.  Signed:  Gerlene Hockey, MD           12/30/2023

## 2024-01-13 ENCOUNTER — Ambulatory Visit: Admitting: Family Medicine

## 2024-01-13 NOTE — Progress Notes (Deleted)
 OFFICE VISIT  01/13/2024  CC: No chief complaint on file.   Patient is a 38 y.o. male who presents for follow-up of hypertension, adult ADD, and anxiety/depression.   INTERIM HX: ***   PMP AWARE reviewed today: most recent rx for methylphenidate  ER 36 mg was filled 10/18/2023, #90, prescription by me. No red flags.  Past Medical History:  Diagnosis Date   Adult ADHD    Allergic rhinitis    Allergy 12/91   Anxiety and depression    resp well to wellbutrin    Asthma childhood   No albuterol  requirement in years, then acute flare 02/2012   COVID-19 virus infection 07/2020   ED (erectile dysfunction) 2014   GERD (gastroesophageal reflux disease) 12/12   Herpes zoster 11/2010   Hypertriglyceridemia    Morbid obesity (HCC)    Plantar fasciitis    [employer] orth[facility] helping   Thyroid  nodule summer 2019   u/s -->2.1 cm-->bx neg.  Has f/u 03/2018.   Tobacco abuse 12/28/2011   Quit smoking 2019, then took up dipping a lot.    Past Surgical History:  Procedure Laterality Date   ADENOIDECTOMY  1993   BIOPSY THYROID   08/2017   NEG for malignancy   WISDOM TOOTH EXTRACTION  2009    Outpatient Medications Prior to Visit  Medication Sig Dispense Refill   albuterol  (VENTOLIN  HFA) 108 (90 Base) MCG/ACT inhaler Inhale 2 puffs into the lungs every 6 (six) hours as needed for wheezing or shortness of breath. 8 g 1   amLODipine -benazepril  (LOTREL) 5-10 MG capsule Take 1 capsule by mouth daily. 30 capsule 0   buPROPion  (WELLBUTRIN  XL) 300 MG 24 hr tablet Take 1 tablet (300 mg total) by mouth daily. 90 tablet 3   doxycycline  (VIBRAMYCIN ) 100 MG capsule Take 100 mg by mouth 2 (two) times daily.     methylphenidate  36 MG PO CR tablet Take 1 tablet (36 mg total) by mouth daily. 90 tablet 0   No facility-administered medications prior to visit.    Allergies[1]  Review of Systems As per HPI  PE:    12/30/2023    2:37 PM 12/30/2023    2:32 PM 12/20/2023   10:03 AM  Vitals with BMI   Height  5' 9   Weight  379 lbs 6 oz   BMI  56   Systolic 146 146 821  Diastolic 79 84 98  Pulse  100      Physical Exam  ***  LABS:  {Labs (Optional):23779}  IMPRESSION AND PLAN:  No problem-specific Assessment & Plan notes found for this encounter.   An After Visit Summary was printed and given to the patient.  FOLLOW UP: No follow-ups on file.  Signed:  Gerlene Hockey, MD           01/13/2024      [1]  Allergies Allergen Reactions   Cephalosporins    Penicillins

## 2024-01-21 ENCOUNTER — Other Ambulatory Visit: Payer: Self-pay | Admitting: Family Medicine

## 2024-01-27 ENCOUNTER — Ambulatory Visit: Admitting: Family Medicine

## 2024-01-28 ENCOUNTER — Other Ambulatory Visit: Payer: Self-pay | Admitting: Family Medicine

## 2024-02-10 ENCOUNTER — Ambulatory Visit: Admitting: Family Medicine

## 2024-02-11 ENCOUNTER — Encounter: Payer: Self-pay | Admitting: Family Medicine
